# Patient Record
Sex: Female | Born: 1955 | Race: White | Hispanic: No | Marital: Married | Smoking: Never smoker
Health system: Southern US, Community
[De-identification: ages and names within clinical notes are randomized; demographics above are authoritative.]

## PROBLEM LIST (undated history)

## (undated) DIAGNOSIS — F32A Depression, unspecified: Secondary | ICD-10-CM

## (undated) DIAGNOSIS — M797 Fibromyalgia: Secondary | ICD-10-CM

## (undated) HISTORY — PX: ABDOMINAL HYSTERECTOMY: SHX81

## (undated) HISTORY — PX: OTHER SURGICAL HISTORY: SHX169

---
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
## 1983-12-22 DIAGNOSIS — M797 Fibromyalgia: Secondary | ICD-10-CM

## 1983-12-22 HISTORY — DX: Fibromyalgia: M79.7

## 2013-10-11 ENCOUNTER — Other Ambulatory Visit: Payer: Self-pay

## 2013-10-11 DIAGNOSIS — Z803 Family history of malignant neoplasm of breast: Secondary | ICD-10-CM

## 2013-10-11 DIAGNOSIS — Z1231 Encounter for screening mammogram for malignant neoplasm of breast: Secondary | ICD-10-CM

## 2013-11-09 ENCOUNTER — Ambulatory Visit: Payer: Self-pay

## 2017-10-21 HISTORY — PX: MASTECTOMY: SHX3

## 2018-01-21 HISTORY — PX: BREAST RECONSTRUCTION: SHX9

## 2018-09-20 HISTORY — PX: BREAST SURGERY: SHX581

## 2018-11-20 HISTORY — PX: BREAST SURGERY: SHX581
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
## 2019-10-05 ENCOUNTER — Ambulatory Visit: Payer: Self-pay

## 2019-10-05 ENCOUNTER — Encounter (INDEPENDENT_AMBULATORY_CARE_PROVIDER_SITE_OTHER): Payer: Self-pay

## 2019-10-05 ENCOUNTER — Encounter: Payer: Self-pay | Admitting: Rheumatology

## 2019-10-05 ENCOUNTER — Other Ambulatory Visit: Payer: Self-pay

## 2019-10-05 ENCOUNTER — Ambulatory Visit: Payer: BC Managed Care – PPO | Admitting: Rheumatology

## 2019-10-05 VITALS — BP 122/79 | HR 72 | Resp 12 | Ht 63.25 in | Wt 153.0 lb

## 2019-10-05 DIAGNOSIS — E538 Deficiency of other specified B group vitamins: Secondary | ICD-10-CM | POA: Insufficient documentation

## 2019-10-05 DIAGNOSIS — M797 Fibromyalgia: Secondary | ICD-10-CM

## 2019-10-05 DIAGNOSIS — M25552 Pain in left hip: Secondary | ICD-10-CM | POA: Diagnosis not present

## 2019-10-05 DIAGNOSIS — M25551 Pain in right hip: Secondary | ICD-10-CM | POA: Diagnosis not present

## 2019-10-05 DIAGNOSIS — E039 Hypothyroidism, unspecified: Secondary | ICD-10-CM

## 2019-10-05 DIAGNOSIS — M79642 Pain in left hand: Secondary | ICD-10-CM | POA: Diagnosis not present

## 2019-10-05 DIAGNOSIS — M545 Low back pain: Secondary | ICD-10-CM | POA: Diagnosis not present

## 2019-10-05 DIAGNOSIS — M542 Cervicalgia: Secondary | ICD-10-CM | POA: Diagnosis not present

## 2019-10-05 DIAGNOSIS — G8929 Other chronic pain: Secondary | ICD-10-CM

## 2019-10-05 DIAGNOSIS — M79641 Pain in right hand: Secondary | ICD-10-CM | POA: Diagnosis not present

## 2019-10-05 DIAGNOSIS — M51369 Other intervertebral disc degeneration, lumbar region without mention of lumbar back pain or lower extremity pain: Secondary | ICD-10-CM | POA: Insufficient documentation

## 2019-10-05 DIAGNOSIS — M79671 Pain in right foot: Secondary | ICD-10-CM

## 2019-10-05 DIAGNOSIS — M5136 Other intervertebral disc degeneration, lumbar region: Secondary | ICD-10-CM | POA: Insufficient documentation

## 2019-10-05 DIAGNOSIS — M25562 Pain in left knee: Secondary | ICD-10-CM

## 2019-10-05 DIAGNOSIS — M7061 Trochanteric bursitis, right hip: Secondary | ICD-10-CM | POA: Diagnosis not present

## 2019-10-05 DIAGNOSIS — K146 Glossodynia: Secondary | ICD-10-CM

## 2019-10-05 DIAGNOSIS — M7062 Trochanteric bursitis, left hip: Secondary | ICD-10-CM

## 2019-10-05 DIAGNOSIS — M79672 Pain in left foot: Secondary | ICD-10-CM

## 2019-10-05 DIAGNOSIS — H04123 Dry eye syndrome of bilateral lacrimal glands: Secondary | ICD-10-CM

## 2019-10-07 LAB — SEDIMENTATION RATE: Sed Rate: 6 mm/h (ref 0–30)

## 2019-10-07 LAB — CYCLIC CITRUL PEPTIDE ANTIBODY, IGG: Cyclic Citrullin Peptide Ab: <16 UNITS

## 2019-10-07 LAB — URIC ACID: Uric Acid, Serum: 5.3 mg/dL (ref 2.5–7.0)

## 2019-10-07 LAB — ANA: Anti Nuclear Antibody (ANA): NEGATIVE

## 2019-10-07 LAB — SJOGRENS SYNDROME-A EXTRACTABLE NUCLEAR ANTIBODY: SSA (Ro) (ENA) Antibody, IgG: <1 AI

## 2019-10-07 LAB — SJOGRENS SYNDROME-B EXTRACTABLE NUCLEAR ANTIBODY: SSB (La) (ENA) Antibody, IgG: <1 AI

## 2019-10-07 LAB — RHEUMATOID FACTOR: Rheumatoid fact SerPl-aCnc: <14 IU/mL (ref <14)

## 2019-10-09 NOTE — Progress Notes (Deleted)
Office Visit Note  Patient: Tracy Francis             Date of Birth: [birth year]           MRN: [id number]             PCP: System, Pcp Not In Referring: No ref. provider found Visit Date: 10/23/2019 Occupation: _0 @  Subjective:  No chief complaint on file.   History of Present Illness: Tracy Francis is a 62 y.o. female ***   Activities of Daily Living:  Patient reports morning stiffness for *** {minute/hour:19697}.   Patient {ACTIONS;DENIES/REPORTS:21021675::"Denies"} nocturnal pain.  Difficulty dressing/grooming: {ACTIONS;DENIES/REPORTS:21021675::"Denies"} Difficulty climbing stairs: {ACTIONS;DENIES/REPORTS:21021675::"Denies"} Difficulty getting out of chair: {ACTIONS;DENIES/REPORTS:21021675::"Denies"} Difficulty using hands for taps, buttons, cutlery, and/or writing: {ACTIONS;DENIES/REPORTS:21021675::"Denies"}  No Rheumatology ROS completed.   PMFS History:  Patient Active Problem List   Diagnosis Date Noted  . DDD (degenerative disc disease), lumbar 10/05/2019  . Dry eyes 10/05/2019  . Fibromyalgia 10/05/2019  . Acquired hypothyroidism 10/05/2019  . 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 B12 deficiency 10/05/2019    Past Medical History:  Diagnosis Date  . Fibromyalgia 1985    Family History  Problem Relation Age of Onset  . Breast cancer Mother   . Lung cancer Mother   . Arthritis Mother   . Diabetes Mother   . Hypertension Mother   . Aneurysm Mother   . Congestive Heart Failure Father   . Emphysema Father   . Hypothyroidism Sister   . COPD Brother   . Diabetes Brother   . Tremor Brother   . Healthy Daughter   . Heart murmur Son   . Healthy Son   . Polycystic ovary syndrome Daughter    Past Surgical History:  Procedure Laterality Date  . ABDOMINAL HYSTERECTOMY    . BREAST RECONSTRUCTION Bilateral 01/2018  . BREAST SURGERY Bilateral 11/2018   nipple reconstruction   . BREAST SURGERY Bilateral 09/2018   revision   . MASTECTOMY Bilateral 10/2017   . upper eye lift     Social History   Social History Narrative  . Not on file    There is no immunization history on file for this patient.   Objective: Vital Signs: There were no vitals taken for this visit.   Physical Exam   Musculoskeletal Exam: ***  CDAI Exam: CDAI Score: - Patient Global: -; Provider Global: - Swollen: -; Tender: - Joint Exam   No joint exam has been documented for this visit   There is currently no information documented on the homunculus. Go to the Rheumatology activity and complete the homunculus joint exam.  Investigation: No additional findings.  Imaging: Xr Hips Bilat W Or W/o Pelvis 3-4 Views  Result Date: 10/05/2019 No hip joint narrowing or sclerosis was noted.  Bilateral SI joints are unremarkable. Impression: Unremarkable x-ray of the hip joints.  Xr Cervical Spine 2 Or 3 Views  Result Date: 10/05/2019 Mild C6-C7 narrowing was noted.  Anterior spurring was noted.  Facet joint arthropathy was noted. Impression: These findings are consistent with C6-C7 narrowing and facet joint arthropathy.  Xr Hand 2 View Left  Result Date: 10/05/2019 PIP and DIP narrowing was noted.  No MCP, intercarpal radiocarpal joint space narrowing was noted. 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  Xr Hand 2 View Right  Result Date: 10/05/2019 PIP and DIP narrowing was noted.  No MCP, intercarpal radiocarpal joint space narrowing was noted. Impression: These findings are consistent with mild osteoarthritis of  the hand.  Xr Lumbar Spine 2-3 Views  Result Date: 10/05/2019 No significant disc space narrowing was noted.  Mild anterior spurring was noted.  Facet joint arthropathy was noted. 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   Recent Labs: No results found for: WBC, HGB, PLT, NA, K, CL, CO2, GLUCOSE, BUN, CREATININE, BILITOT, ALKPHOS, AST, ALT, PROT, ALBUMIN,  CALCIUM, GFRAA, QFTBGOLD, QFTBGOLDPLUS   October 05, 2019 ESR 6, RF negative, anti-CCP negative, uric acid 5.3, ANA negative, SSA negative, SSB negative  Speciality Comments: No specialty comments available.  Procedures:  No procedures performed Allergies: Patient has no known allergies.   Assessment / Plan:     Visit Diagnoses: No diagnosis found.  Orders: No orders of the defined types were placed in this encounter.  No orders of the defined types were placed in this encounter.   Face-to-face time spent with patient was *** minutes. Greater than 50% of time was spent in counseling and coordination of care.  Follow-Up Instructions: No follow-ups on file.   Bo Merino, MD  Note - This record has been created using Editor, commissioning.  Chart creation errors have been sought, but may not always  have been located. Such creation errors do not reflect on  the standard of medical care.

## 2019-10-17 NOTE — Progress Notes (Deleted)
Office Visit Note  Patient: Tracy Francis             Date of Birth: 03-Nov-1956           MRN: [id number]             PCP: System, Pcp Not In Referring: No ref. provider found Visit Date: 10/19/2019 Occupation: _0 @  Subjective:  No chief complaint on file.   History of Present Illness: Tracy Francis is a 63 y.o. female ***   Activities of Daily Living:  Patient reports morning stiffness for *** {minute/hour:19697}.   Patient {ACTIONS;DENIES/REPORTS:21021675::"Denies"} nocturnal pain.  Difficulty dressing/grooming: {ACTIONS;DENIES/REPORTS:21021675::"Denies"} Difficulty climbing stairs: {ACTIONS;DENIES/REPORTS:21021675::"Denies"} Difficulty getting out of chair: {ACTIONS;DENIES/REPORTS:21021675::"Denies"} Difficulty using hands for taps, buttons, cutlery, and/or writing: {ACTIONS;DENIES/REPORTS:21021675::"Denies"}  No Rheumatology ROS completed.   PMFS History:  Patient Active Problem List   Diagnosis Date Noted  . DDD (degenerative disc disease), lumbar 10/05/2019  . Dry eyes 10/05/2019  . Fibromyalgia 10/05/2019  . Acquired hypothyroidism 10/05/2019  . 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 B12 deficiency 10/05/2019    Past Medical History:  Diagnosis Date  . Fibromyalgia 1985    Family History  Problem Relation Age of Onset  . Breast cancer Mother   . Lung cancer Mother   . Arthritis Mother   . Diabetes Mother   . Hypertension Mother   . Aneurysm Mother   . Congestive Heart Failure Father   . Emphysema Father   . Hypothyroidism Sister   . COPD Brother   . Diabetes Brother   . Tremor Brother   . Healthy Daughter   . Heart murmur Son   . Healthy Son   . Polycystic ovary syndrome Daughter    Past Surgical History:  Procedure Laterality Date  . ABDOMINAL HYSTERECTOMY    . BREAST RECONSTRUCTION Bilateral 01/2018  . BREAST SURGERY Bilateral 11/2018   nipple reconstruction   . BREAST SURGERY Bilateral 09/2018   revision   . MASTECTOMY Bilateral 10/2017   . upper eye lift     Social History   Social History Narrative  . Not on file    There is no immunization history on file for this patient.   Objective: Vital Signs: There were no vitals taken for this visit.   Physical Exam   Musculoskeletal Exam: ***  CDAI Exam: CDAI Score: - Patient Global: -; Provider Global: - Swollen: -; Tender: - Joint Exam   No joint exam has been documented for this visit   There is currently no information documented on the homunculus. Go to the Rheumatology activity and complete the homunculus joint exam.  Investigation: No additional findings.  Imaging: Xr Hips Bilat W Or W/o Pelvis 3-4 Views  Result Date: 10/05/2019 No hip joint narrowing or sclerosis was noted.  Bilateral SI joints are unremarkable. Impression: Unremarkable x-ray of the hip joints.  Xr Cervical Spine 2 Or 3 Views  Result Date: 10/05/2019 Mild C6-C7 narrowing was noted.  Anterior spurring was noted.  Facet joint arthropathy was noted. Impression: These findings are consistent with C6-C7 narrowing and facet joint arthropathy.  Xr Hand 2 View Left  Result Date: 10/05/2019 PIP and DIP narrowing was noted.  No MCP, intercarpal radiocarpal joint space narrowing was noted. 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  Xr Hand 2 View Right  Result Date: 10/05/2019 PIP and DIP narrowing was noted.  No MCP, intercarpal radiocarpal joint space narrowing was noted. Impression: These findings are consistent with mild osteoarthritis of  the hand.  Xr Lumbar Spine 2-3 Views  Result Date: 10/05/2019 No significant disc space narrowing was noted.  Mild anterior spurring was noted.  Facet joint arthropathy was noted. 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   Recent Labs: No results found for: WBC, HGB, PLT, NA, K, CL, CO2, GLUCOSE, BUN, CREATININE, BILITOT, ALKPHOS, AST, ALT, PROT, ALBUMIN,  CALCIUM, GFRAA, QFTBGOLD, QFTBGOLDPLUS   October 05, 2019 RF negative, anti-CCP negative, ANA negative, Ro negative, La negative, ESR 6, uric acid 5.3  Speciality Comments: No specialty comments available.  Procedures:  No procedures performed Allergies: Patient has no known allergies.   Assessment / Plan:     Visit Diagnoses: No diagnosis found.  Orders: No orders of the defined types were placed in this encounter.  No orders of the defined types were placed in this encounter.   Face-to-face time spent with patient was *** minutes. Greater than 50% of time was spent in counseling and coordination of care.  Follow-Up Instructions: No follow-ups on file.   Bo Merino, MD  Note - This record has been created using Editor, commissioning.  Chart creation errors have been sought, but may not always  have been located. Such creation errors do not reflect on  the standard of medical care.

## 2019-10-19 ENCOUNTER — Ambulatory Visit: Payer: Self-pay | Admitting: Rheumatology

## 2019-10-19 NOTE — Progress Notes (Deleted)
Office Visit Note  Patient: Tracy Francis             Date of Birth: 1956/08/14           MRN: [id number]             PCP: System, Pcp Not In Referring: No ref. provider found Visit Date: 10/26/2019 Occupation: _0 @  Subjective:  No chief complaint on file.   History of Present Illness: Tracy Francis is a 63 y.o. female ***   Activities of Daily Living:  Patient reports morning stiffness for *** {minute/hour:19697}.   Patient {ACTIONS;DENIES/REPORTS:21021675::"Denies"} nocturnal pain.  Difficulty dressing/grooming: {ACTIONS;DENIES/REPORTS:21021675::"Denies"} Difficulty climbing stairs: {ACTIONS;DENIES/REPORTS:21021675::"Denies"} Difficulty getting out of chair: {ACTIONS;DENIES/REPORTS:21021675::"Denies"} Difficulty using hands for taps, buttons, cutlery, and/or writing: {ACTIONS;DENIES/REPORTS:21021675::"Denies"}  No Rheumatology ROS completed.   PMFS History:  Patient Active Problem List   Diagnosis Date Noted  . DDD (degenerative disc disease), lumbar 10/05/2019  . Dry eyes 10/05/2019  . Fibromyalgia 10/05/2019  . Acquired hypothyroidism 10/05/2019  . 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 B12 deficiency 10/05/2019    Past Medical History:  Diagnosis Date  . Fibromyalgia 1985    Family History  Problem Relation Age of Onset  . Breast cancer Mother   . Lung cancer Mother   . Arthritis Mother   . Diabetes Mother   . Hypertension Mother   . Aneurysm Mother   . Congestive Heart Failure Father   . Emphysema Father   . Hypothyroidism Sister   . COPD Brother   . Diabetes Brother   . Tremor Brother   . Healthy Daughter   . Heart murmur Son   . Healthy Son   . Polycystic ovary syndrome Daughter    Past Surgical History:  Procedure Laterality Date  . ABDOMINAL HYSTERECTOMY    . BREAST RECONSTRUCTION Bilateral 01/2018  . BREAST SURGERY Bilateral 11/2018   nipple reconstruction   . BREAST SURGERY Bilateral 09/2018   revision   . MASTECTOMY Bilateral 10/2017   . upper eye lift     Social History   Social History Narrative  . Not on file    There is no immunization history on file for this patient.   Objective: Vital Signs: There were no vitals taken for this visit.   Physical Exam   Musculoskeletal Exam: ***  CDAI Exam: CDAI Score: - Patient Global: -; Provider Global: - Swollen: -; Tender: - Joint Exam   No joint exam has been documented for this visit   There is currently no information documented on the homunculus. Go to the Rheumatology activity and complete the homunculus joint exam.  Investigation: No additional findings.  Imaging: Xr Hips Bilat W Or W/o Pelvis 3-4 Views  Result Date: 10/05/2019 No hip joint narrowing or sclerosis was noted.  Bilateral SI joints are unremarkable. Impression: Unremarkable x-ray of the hip joints.  Xr Cervical Spine 2 Or 3 Views  Result Date: 10/05/2019 Mild C6-C7 narrowing was noted.  Anterior spurring was noted.  Facet joint arthropathy was noted. Impression: These findings are consistent with C6-C7 narrowing and facet joint arthropathy.  Xr Hand 2 View Left  Result Date: 10/05/2019 PIP and DIP narrowing was noted.  No MCP, intercarpal radiocarpal joint space narrowing was noted. 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  Xr Hand 2 View Right  Result Date: 10/05/2019 PIP and DIP narrowing was noted.  No MCP, intercarpal radiocarpal joint space narrowing was noted. Impression: These findings are consistent with mild osteoarthritis of  the hand.  Xr Lumbar Spine 2-3 Views  Result Date: 10/05/2019 No significant disc space narrowing was noted.  Mild anterior spurring was noted.  Facet joint arthropathy was noted. 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   Recent Labs: No results found for: WBC, HGB, PLT, NA, K, CL, CO2, GLUCOSE, BUN, CREATININE, BILITOT, ALKPHOS, AST, ALT, PROT, ALBUMIN,  CALCIUM, GFRAA, QFTBGOLD, QFTBGOLDPLUS  Speciality Comments: No specialty comments available.   October 05, 2019 ESR 6, RF negative, anti-CCP negative, ANA negative, Ro negative, La negative, uric acid 5.3   Procedures:  No procedures performed Allergies: Patient has no known allergies.   Assessment / Plan:     Visit Diagnoses: No diagnosis found.  Orders: No orders of the defined types were placed in this encounter.  No orders of the defined types were placed in this encounter.   Face-to-face time spent with patient was *** minutes. Greater than 50% of time was spent in counseling and coordination of care.  Follow-Up Instructions: No follow-ups on file.   Bo Merino, MD  Note - This record has been created using Editor, commissioning.  Chart creation errors have been sought, but may not always  have been located. Such creation errors do not reflect on  the standard of medical care.

## 2019-10-23 ENCOUNTER — Ambulatory Visit: Payer: Self-pay | Admitting: Rheumatology

## 2019-10-26 ENCOUNTER — Ambulatory Visit: Payer: BC Managed Care – PPO | Admitting: Rheumatology

## 2022-01-13 ENCOUNTER — Other Ambulatory Visit: Payer: Self-pay | Admitting: Orthopedic Surgery

## 2022-01-13 DIAGNOSIS — M4722 Other spondylosis with radiculopathy, cervical region: Secondary | ICD-10-CM

## 2022-02-02 ENCOUNTER — Ambulatory Visit
Admission: RE | Admit: 2022-02-02 | Discharge: 2022-02-02 | Disposition: A | Discharge status: Home / Self Care | Payer: Medicare PPO | Source: Ambulatory Visit | Attending: Orthopedic Surgery | Admitting: Orthopedic Surgery

## 2022-02-02 ENCOUNTER — Other Ambulatory Visit: Payer: Self-pay

## 2022-02-02 DIAGNOSIS — M4722 Other spondylosis with radiculopathy, cervical region: Secondary | ICD-10-CM

## 2022-02-04 ENCOUNTER — Other Ambulatory Visit: Payer: Medicare PPO

## 2023-07-21 ENCOUNTER — Other Ambulatory Visit: Payer: Self-pay | Admitting: Orthopedic Surgery

## 2023-07-21 DIAGNOSIS — M4316 Spondylolisthesis, lumbar region: Secondary | ICD-10-CM

## 2023-08-03 ENCOUNTER — Encounter: Payer: Self-pay | Admitting: Orthopedic Surgery

## 2023-08-13 ENCOUNTER — Other Ambulatory Visit: Payer: Medicare PPO

## 2023-09-09 ENCOUNTER — Other Ambulatory Visit: Payer: Medicare PPO

## 2023-10-04 IMAGING — MR MR CERVICAL SPINE W/O CM
5 series · 37 of 48 positions shown · non-contrast
Comparison: MR cervical 07/30/2017; X-ray cervical 10/05/2019.

CLINICAL DATA: Neck pain radiating into right arm for 2 months.
History of trauma 16 years ago with a C2 and C3 fracture. Cervical
spondylosis with radiculopathy 0XB.FF ([id number]).

EXAM:
MRI CERVICAL SPINE WITHOUT CONTRAST
TECHNIQUE: Multiplanar, multisequence MR imaging of the cervical spine was
performed. No intravenous contrast was administered.

[Series 2: T2 · sagittal · 3.0mm · 0.41mm/px · 9 of 17 slices shown (1 of 2)]
[im 1/17]
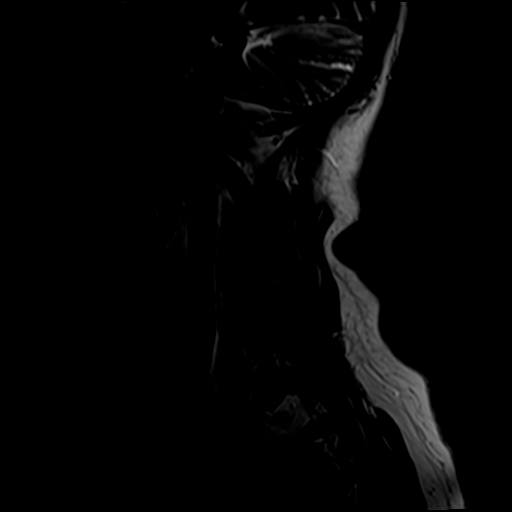
[im 3/17]
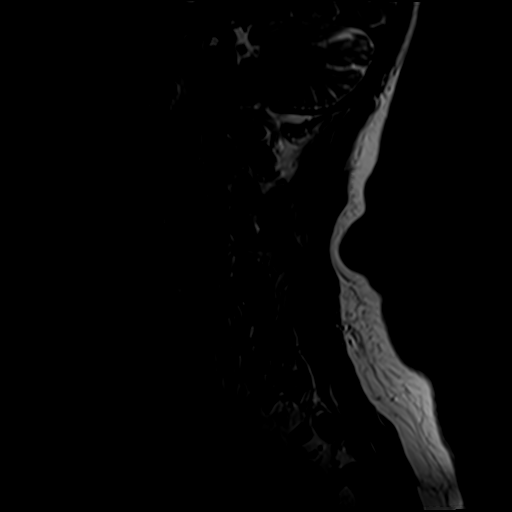
[im 5/17]
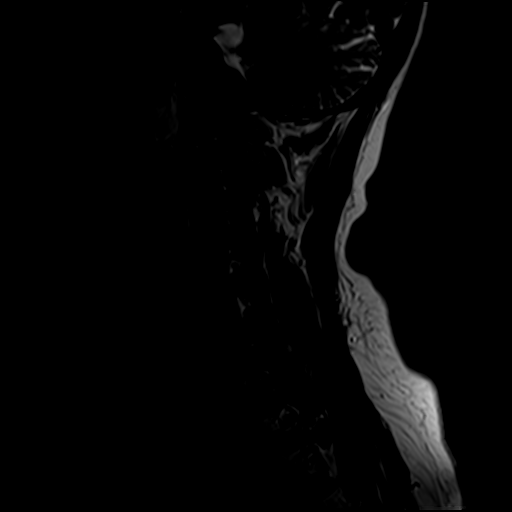
[im 7/17]
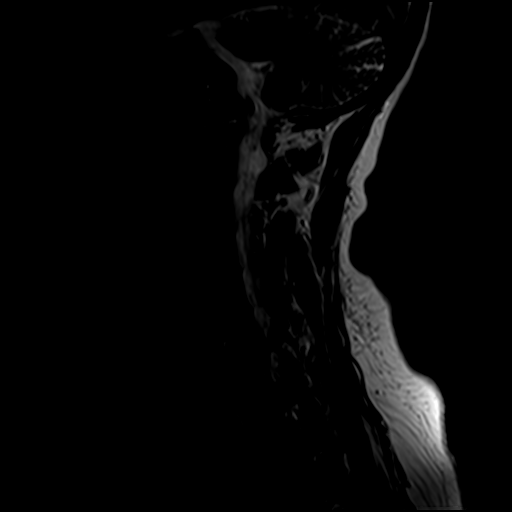
[im 9/17]
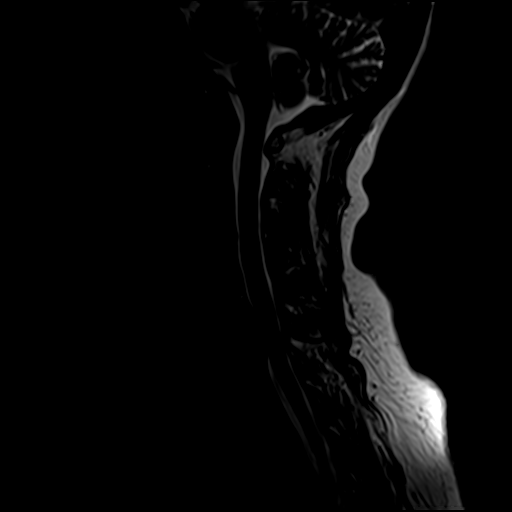
[im 11/17]
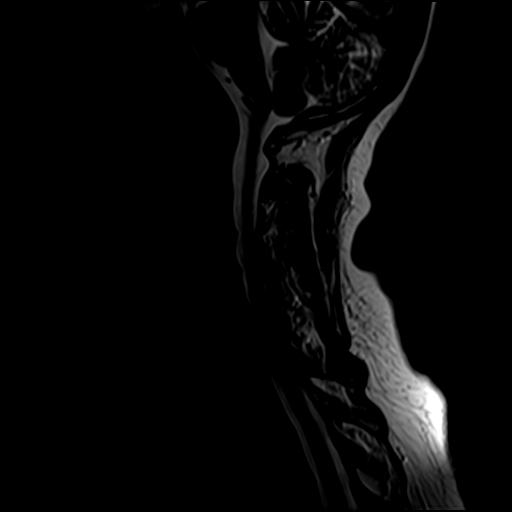
[im 13/17]
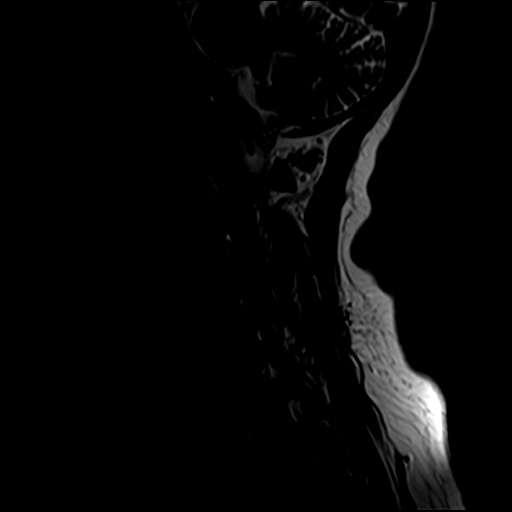
[im 15/17]
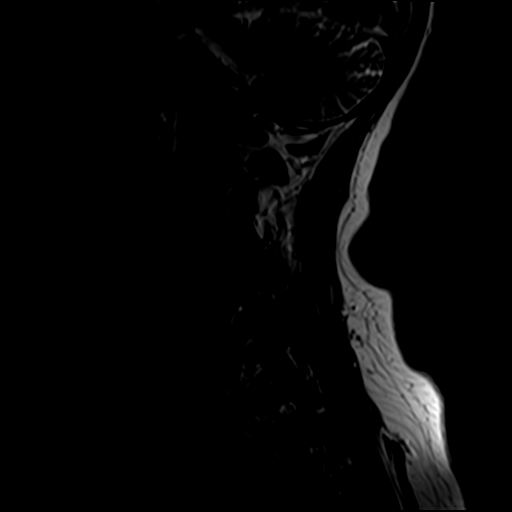
[im 17/17]
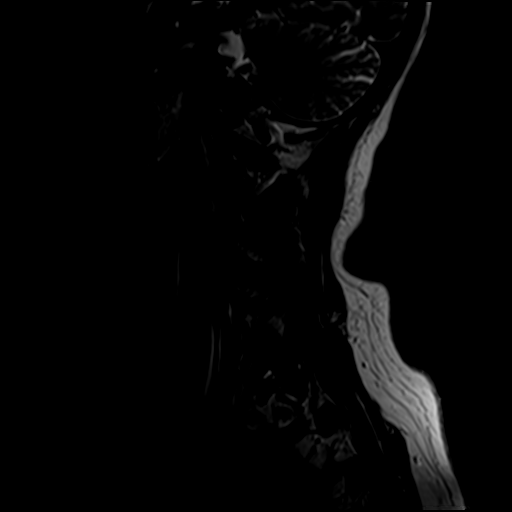

[Series 3: STIR · sagittal · 3.0mm · 0.82mm/px · 8 of 17 slices shown]
[im 1/17]
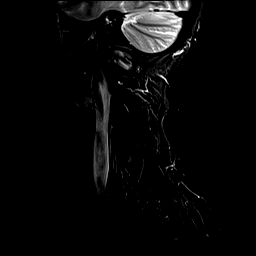
[im 3/17]
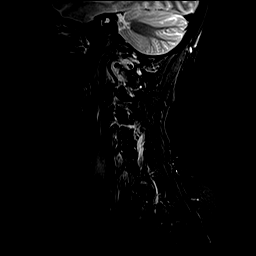
[im 5/17]
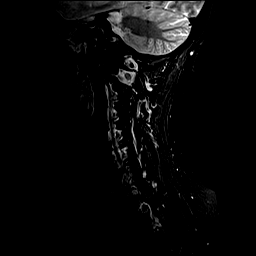
[im 7/17]
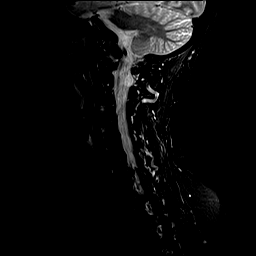
[im 10/17]
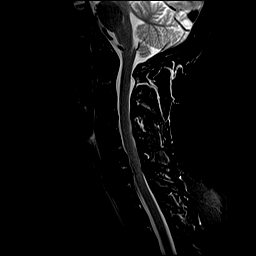
[im 12/17]
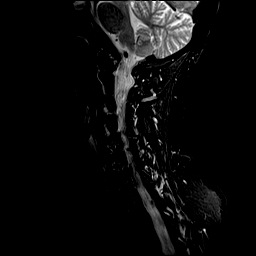
[im 14/17]
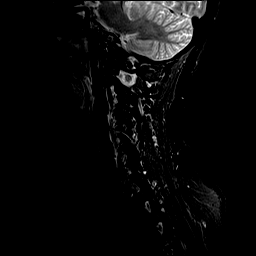
[im 17/17]
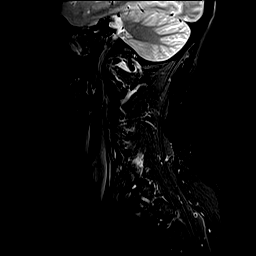

[Series 4: T1 · sagittal · 3.0mm · 0.82mm/px · 8 of 17 slices shown]
[im 1/17]
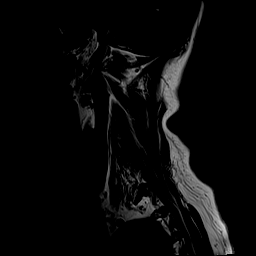
[im 3/17]
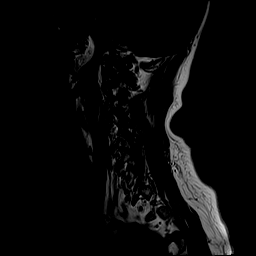
[im 5/17]
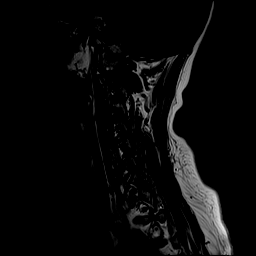
[im 7/17]
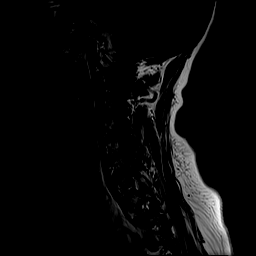
[im 10/17]
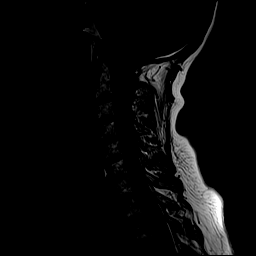
[im 12/17]
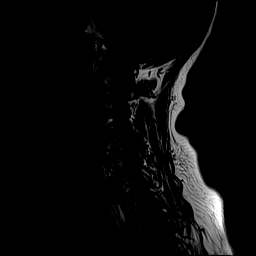
[im 14/17]
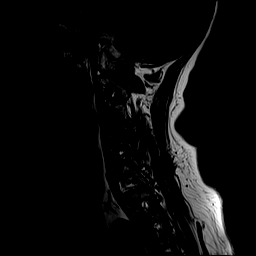
[im 17/17]
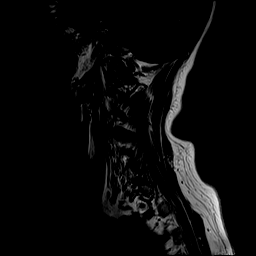

[Series 5: T2 · axial · 3.0mm · 0.70mm/px · z∈[-72,+19]mm · 11 of 24 slices shown (2 of 2)]
[im 1/24]
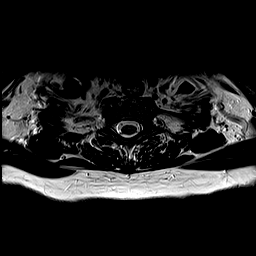
[im 3/24]
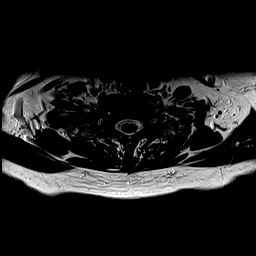
[im 5/24]
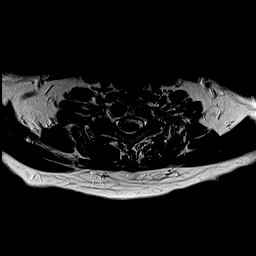
[im 7/24]
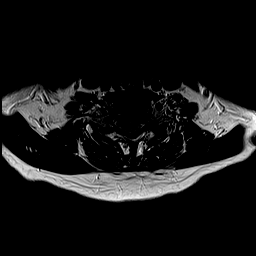
[im 10/24]
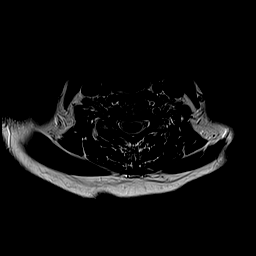
[im 12/24]
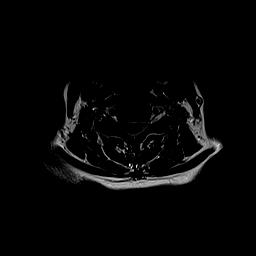
[im 14/24]
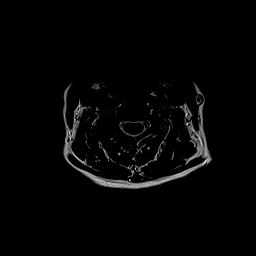
[im 17/24]
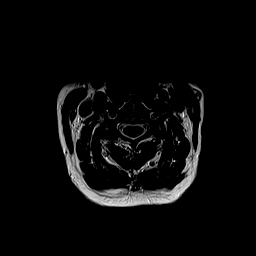
[im 19/24]
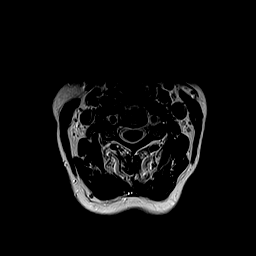
[im 21/24]
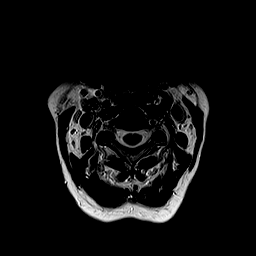
[im 24/24]
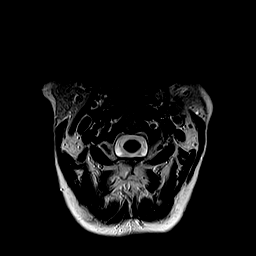

[Series 6: GRE · axial · 3.0mm · 0.35mm/px · 1 of 26 slices shown]
[im 1/26]
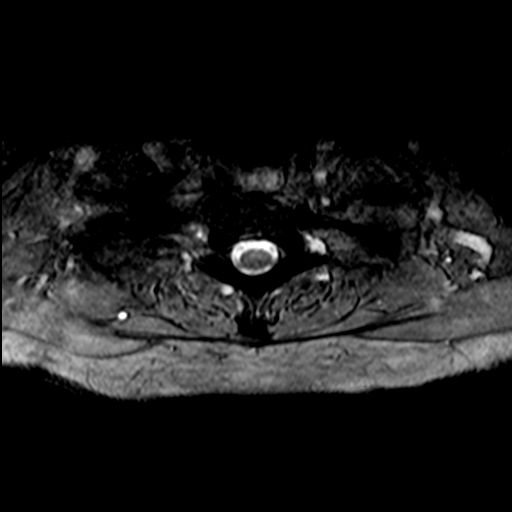

[37 of 48 positions shown; findings below may reference images not displayed]

FINDINGS: Alignment: Normal.

Vertebrae: No fracture, evidence of discitis, or bone lesion.
Endplate degenerative changes at C6-7.

Cord: Mild mass effect on the cord at C5-6 and C6-7 without cord
signal abnormality.

Posterior Fossa, vertebral arteries, paraspinal tissues: Negative.

Disc levels:

C2-3: Mild facet degenerative changes. No spinal canal or neural
foraminal stenosis.

C3-4: Uncovertebral and facet degenerative changes resulting in mild
right neural foraminal narrowing. No spinal canal.

C4-5: Facet degenerative changes, right greater than left. No
significant spinal canal or neural foraminal stenosis.

C5-6: Right posterolateral disc protrusion causing minimal mass
effect on the cord and resulting in mild spinal canal stenosis.
Uncovertebral and facet degenerative changes without significant
neural foraminal narrowing.

C6-7: Posterior disc osteophyte complex, asymmetric to the left,
resulting in mild mass effect on the cord and mild to moderate
spinal canal stenosis. Uncovertebral and facet degenerative changes
resulting in moderate right and severe left neural foraminal
narrowing.

C7-T1: Facet degenerative changes. No significant spinal canal or
neural foraminal stenosis.
IMPRESSION: Degenerative changes of the cervical spine, more pronounced at C6-7
where there is mild-to-moderate spinal canal stenosis, moderate
right and severe left neural foraminal narrowing.

## 2024-02-27 DIAGNOSIS — M5416 Radiculopathy, lumbar region: Secondary | ICD-10-CM | POA: Diagnosis not present

## 2024-03-02 DIAGNOSIS — M5416 Radiculopathy, lumbar region: Secondary | ICD-10-CM | POA: Diagnosis not present

## 2024-03-02 DIAGNOSIS — M47816 Spondylosis without myelopathy or radiculopathy, lumbar region: Secondary | ICD-10-CM | POA: Diagnosis not present

## 2024-03-06 DIAGNOSIS — Z01419 Encounter for gynecological examination (general) (routine) without abnormal findings: Secondary | ICD-10-CM | POA: Diagnosis not present

## 2024-03-06 DIAGNOSIS — Z6824 Body mass index (BMI) 24.0-24.9, adult: Secondary | ICD-10-CM | POA: Diagnosis not present

## 2024-03-06 DIAGNOSIS — N760 Acute vaginitis: Secondary | ICD-10-CM | POA: Diagnosis not present

## 2024-03-06 DIAGNOSIS — M545 Low back pain, unspecified: Secondary | ICD-10-CM | POA: Diagnosis not present

## 2024-03-07 DIAGNOSIS — H40053 Ocular hypertension, bilateral: Secondary | ICD-10-CM | POA: Diagnosis not present

## 2024-03-14 DIAGNOSIS — M47816 Spondylosis without myelopathy or radiculopathy, lumbar region: Secondary | ICD-10-CM | POA: Diagnosis not present

## 2024-03-27 DIAGNOSIS — M47816 Spondylosis without myelopathy or radiculopathy, lumbar region: Secondary | ICD-10-CM | POA: Diagnosis not present

## 2024-03-27 DIAGNOSIS — M25551 Pain in right hip: Secondary | ICD-10-CM | POA: Diagnosis not present

## 2024-03-27 DIAGNOSIS — M5416 Radiculopathy, lumbar region: Secondary | ICD-10-CM | POA: Diagnosis not present

## 2024-03-28 DIAGNOSIS — S93491A Sprain of other ligament of right ankle, initial encounter: Secondary | ICD-10-CM | POA: Diagnosis not present

## 2024-03-28 DIAGNOSIS — S93401D Sprain of unspecified ligament of right ankle, subsequent encounter: Secondary | ICD-10-CM | POA: Diagnosis not present

## 2024-03-28 DIAGNOSIS — M67961 Unspecified disorder of synovium and tendon, right lower leg: Secondary | ICD-10-CM | POA: Diagnosis not present

## 2024-03-28 DIAGNOSIS — G7249 Other inflammatory and immune myopathies, not elsewhere classified: Secondary | ICD-10-CM | POA: Diagnosis not present

## 2024-04-19 DIAGNOSIS — M25551 Pain in right hip: Secondary | ICD-10-CM | POA: Diagnosis not present

## 2024-04-20 DIAGNOSIS — I341 Nonrheumatic mitral (valve) prolapse: Secondary | ICD-10-CM | POA: Diagnosis not present

## 2024-04-20 DIAGNOSIS — R002 Palpitations: Secondary | ICD-10-CM | POA: Diagnosis not present

## 2024-04-20 DIAGNOSIS — R0602 Shortness of breath: Secondary | ICD-10-CM | POA: Diagnosis not present

## 2024-04-20 DIAGNOSIS — Z01810 Encounter for preprocedural cardiovascular examination: Secondary | ICD-10-CM | POA: Diagnosis not present

## 2024-04-24 DIAGNOSIS — R0602 Shortness of breath: Secondary | ICD-10-CM | POA: Diagnosis not present

## 2024-04-24 DIAGNOSIS — I341 Nonrheumatic mitral (valve) prolapse: Secondary | ICD-10-CM | POA: Diagnosis not present

## 2024-04-24 DIAGNOSIS — R002 Palpitations: Secondary | ICD-10-CM | POA: Diagnosis not present

## 2024-04-24 DIAGNOSIS — Z01810 Encounter for preprocedural cardiovascular examination: Secondary | ICD-10-CM | POA: Diagnosis not present

## 2024-04-25 DIAGNOSIS — M4316 Spondylolisthesis, lumbar region: Secondary | ICD-10-CM | POA: Diagnosis not present

## 2024-04-26 DIAGNOSIS — M25551 Pain in right hip: Secondary | ICD-10-CM | POA: Diagnosis not present

## 2024-04-26 DIAGNOSIS — M545 Low back pain, unspecified: Secondary | ICD-10-CM | POA: Diagnosis not present

## 2024-04-27 ENCOUNTER — Ambulatory Visit: Payer: Self-pay | Admitting: Orthopedic Surgery

## 2024-04-27 DIAGNOSIS — R768 Other specified abnormal immunological findings in serum: Secondary | ICD-10-CM | POA: Diagnosis not present

## 2024-04-27 DIAGNOSIS — M5431 Sciatica, right side: Secondary | ICD-10-CM | POA: Diagnosis not present

## 2024-04-27 DIAGNOSIS — M25571 Pain in right ankle and joints of right foot: Secondary | ICD-10-CM | POA: Diagnosis not present

## 2024-04-27 DIAGNOSIS — M48062 Spinal stenosis, lumbar region with neurogenic claudication: Secondary | ICD-10-CM

## 2024-04-27 DIAGNOSIS — M1991 Primary osteoarthritis, unspecified site: Secondary | ICD-10-CM | POA: Diagnosis not present

## 2024-04-27 DIAGNOSIS — M797 Fibromyalgia: Secondary | ICD-10-CM | POA: Diagnosis not present

## 2024-04-27 DIAGNOSIS — E663 Overweight: Secondary | ICD-10-CM | POA: Diagnosis not present

## 2024-04-27 DIAGNOSIS — Z6827 Body mass index (BMI) 27.0-27.9, adult: Secondary | ICD-10-CM | POA: Diagnosis not present

## 2024-04-27 NOTE — H&P (View-Only) (Signed)
 Tracy Francis is an 68 y.o. female.   Chief Complaint: back and right leg pain HPI: Location: right; low back Severity: pain level 8/10 Duration: 11 months Timing: chronic Context: fall; June 2024 Alleviating Factors: cymbalta 30mg  Ibuprofen or Meloxicam Aggravating Factors: sitting Associated Symptoms: right buttock and hip Prior Imaging: x ray; MRI Previous Injections: helped a little; 03/14/2024 - bilateral facet L4-5 Dr Phares Brasher 01/06/24 Right troch injection and 11/10/23 bilat SI joint injection Notes: AO Paola Bohr, PA 06/18/23 - x-rays - patient followed up with pain management @ [facility] after. Dr. Vonn Guard at [facility] Dr. Blain Bulls @ [facility] - Neurology Dr. Nadia Aurora - [facility] - ordered lumbar MRI right hip MRI follow up pain radiates down the buttock thigh down to her outside of her calf and top of her foot.  Past Medical History:  Diagnosis Date   Fibromyalgia 1985    Past Surgical History:  Procedure Laterality Date   ABDOMINAL HYSTERECTOMY     BREAST RECONSTRUCTION Bilateral 01/2018   BREAST SURGERY Bilateral 11/2018   nipple reconstruction    BREAST SURGERY Bilateral 09/2018   revision    MASTECTOMY Bilateral 10/2017   upper eye lift      Family History  Problem Relation Age of Onset   Breast cancer Mother    Lung cancer Mother    Arthritis Mother    Diabetes Mother    Hypertension Mother    Aneurysm Mother    Congestive Heart Failure Father    Emphysema Father    Hypothyroidism Sister    COPD Brother    Diabetes Brother    Tremor Brother    Healthy Daughter    Heart murmur Son    Healthy Son    Polycystic ovary syndrome Daughter    Social History:  reports that she has never smoked. She has never used smokeless tobacco. She reports current alcohol use. She reports that she does not use drugs.  Allergies: No Known Allergies  Current meds: Armour Thyroid 15 mg tablet diclofenac sodium 75 mg tablet,delayed  release DULoxetine 30 mg capsule,delayed release estradioL 0.01% (0.1 mg/gram) vaginal cream estradioL 0.025 mg/24 hr semiweekly transdermal patch EstroGel 1.25 gram/actuation (0.06%) transdermal gel pump fluconazole 150 mg tablet meloxicam 7.5 mg tablet methocarbamoL 500 mg tablet nystatin 100,000 unit/gram topical cream pantoprazole 40 mg tablet,delayed release phenazopyridine 200 mg tablet Saxenda 3 mg/0.5 mL (18 mg/3 mL) subcutaneous pen injector terconazole 0.8 % vaginal cream triamcinolone acetonide 0.1 % topical cream  Review of Systems  Constitutional: Negative.   HENT: Negative.    Eyes: Negative.   Respiratory: Negative.    Cardiovascular: Negative.   Gastrointestinal: Negative.   Endocrine: Negative.   Genitourinary: Negative.   Musculoskeletal:  Positive for back pain and gait problem.  Skin: Negative.   Neurological:  Positive for weakness and numbness.  Psychiatric/Behavioral: Negative.      There were no vitals taken for this visit. Physical Exam Constitutional:      Appearance: Normal appearance.  HENT:     Head: Normocephalic and atraumatic.     Right Ear: External ear normal.     Left Ear: External ear normal.     Nose: Nose normal.     Mouth/Throat:     Pharynx: Oropharynx is clear.  Eyes:     Conjunctiva/sclera: Conjunctivae normal.  Cardiovascular:     Rate and Rhythm: Normal rate and regular rhythm.     Pulses: Normal pulses.     Heart sounds: Normal  heart sounds.  Pulmonary:     Effort: Pulmonary effort is normal.     Breath sounds: Normal breath sounds.  Abdominal:     General: Bowel sounds are normal.  Musculoskeletal:     Cervical back: Normal range of motion and neck supple.     Comments: Tender right proximal gluteus. She has no pain in the groin with internal/external rotation of the hip she is tender over the trochanter. Straight leg raise is markedly positive her buttock thigh lateral calf pain exacerbated with a dorsal augmentation  maneuver EHL on the right is 4/5 compared to the left. Sensation is intact.  Neurological:     Mental Status: She is alert.    MRI of the hip demonstrates some tearing of the anterior superior labrum. Global labral degeneration and moderate to severe tendinosis of the gluteus minimus. 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  MRI of the lumbar spine after speaking with the radiologist indicates a minimal spondylolisthesis at L4-5 due to facet arthrosis. 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  Assessment/Plan Impression:  1. Right lower extremity radicular pain L5 nerve root distribution secondary to lateral recess stenosis and a synovial cyst impinging upon the L5 nerve root with myotomal weakness dermatomal dysesthesias 2. Tearing of the anterior superior labrum with moderate to severe tendinosis of the gluteus minimus 3. Positive ANA  Plan:  I do feel that her clinical symptoms are consistent with the pathology noted on her MRI. Facet hypertrophy and a synovial cyst impinging upon the L5 nerve root. She has neural tension signs and EHL weakness and has failed conservative treatment. Options include living with her symptoms versus a lumbar micro hemilaminotomy and excision of synovial cyst. This would address the buttock and leg pain only. Not back pain. I indicated any back pain would require a fusion. I believe the minimal listhesis and the well-maintained disc space does not indicate for a concomitant fusion. She does have some back pain but is not disabling like her leg pain.  In terms of her hip she does not have groin pain with internal/external rotation of the hip or mechanical catching. Likely the labrum is asymptomatic. 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 That may be related to spasming of the gluteus and tightening of the tensor fascia lotta. That may improve following the decompression. Will have her see one of the hip  guys for an evaluation.  I had an extensive discussion with the patient concerning the pathology relevant anatomy and treatment options. At this point exhausting conservative treatment and in the presence of a neurologic deficit we discussed microlumbar decompression. I discussed the risks and benefits including bleeding, infection, DVT, PE, anesthetic complications, worsening in their symptoms, improvement in their symptoms, C SF leakage, epidural fibrosis, need for future surgeries such as revision discectomy and lumbar fusion. I also indicated that this is an operation to basically decompress the nerve roots to allow recovery as opposed to fixing a herniated disc if it is encountered and that the incidence of recurrent chest disc herniation can approach 15%. Also that nerve root recovery is variable and may not recover completely. Any ligament or bone that is contributing to compressing the nerves will be removed as well.  I discussed the operative course including overnight in the hospital. Immediate ambulation. Follow-up in 2 weeks for suture removal. 6 weeks until healing of the herniation and surgical incision followed by 6 weeks of reconditioning and strengthening  of the core musculature. Also discussed the need to employ the concepts of disc pressure management and core motion following the surgery to minimize the risk of recurrent disc herniation. We will obtain preoperative clearance i if necessary and proceed accordingly.  She will call after she sees the rheumatologist further elevated ANA. Preoperative clearance. Capsule. Oxycodone. No history of DVT or MRSA.  Impression: hemilaminotomy L4-5 right  Barba Levin, PA-C for Dr Leighton Punches 04/27/2024, 12:23 PM

## 2024-04-27 NOTE — H&P (Signed)
 Tracy Francis is an 68 y.o. female.   Chief Complaint: back and right leg pain HPI: Location: right; low back Severity: pain level 8/10 Duration: 11 months Timing: chronic Context: fall; June 2024 Alleviating Factors: cymbalta 30mg  Ibuprofen or Meloxicam Aggravating Factors: sitting Associated Symptoms: right buttock and hip Prior Imaging: x ray; MRI Previous Injections: helped a little; 03/14/2024 - bilateral facet L4-5 Dr Phares Brasher 01/06/24 Right troch injection and 11/10/23 bilat SI joint injection Notes: AO Paola Bohr, PA 06/18/23 - x-rays - patient followed up with pain management @ [facility] after. Dr. Vonn Guard at [facility] Dr. Blain Bulls @ [facility] in [facility] - Neurology Dr. Nadia Aurora - [facility] - ordered lumbar MRI right hip MRI follow up pain radiates down the buttock thigh down to her outside of her calf and top of her foot.  Past Medical History:  Diagnosis Date   Fibromyalgia 1985    Past Surgical History:  Procedure Laterality Date   ABDOMINAL HYSTERECTOMY     BREAST RECONSTRUCTION Bilateral 01/2018   BREAST SURGERY Bilateral 11/2018   nipple reconstruction    BREAST SURGERY Bilateral 09/2018   revision    MASTECTOMY Bilateral 10/2017   upper eye lift      Family History  Problem Relation Age of Onset   Breast cancer Mother    Lung cancer Mother    Arthritis Mother    Diabetes Mother    Hypertension Mother    Aneurysm Mother    Congestive Heart Failure Father    Emphysema Father    Hypothyroidism Sister    COPD Brother    Diabetes Brother    Tremor Brother    Healthy Daughter    Heart murmur Son    Healthy Son    Polycystic ovary syndrome Daughter    Social History:  reports that she has never smoked. She has never used smokeless tobacco. She reports current alcohol use. She reports that she does not use drugs.  Allergies: No Known Allergies  Current meds: Armour Thyroid 15 mg tablet diclofenac sodium 75 mg tablet,delayed  release DULoxetine 30 mg capsule,delayed release estradioL 0.01% (0.1 mg/gram) vaginal cream estradioL 0.025 mg/24 hr semiweekly transdermal patch EstroGel 1.25 gram/actuation (0.06%) transdermal gel pump fluconazole 150 mg tablet meloxicam 7.5 mg tablet methocarbamoL 500 mg tablet nystatin 100,000 unit/gram topical cream pantoprazole 40 mg tablet,delayed release phenazopyridine 200 mg tablet Saxenda 3 mg/0.5 mL (18 mg/3 mL) subcutaneous pen injector terconazole 0.8 % vaginal cream triamcinolone acetonide 0.1 % topical cream  Review of Systems  Constitutional: Negative.   HENT: Negative.    Eyes: Negative.   Respiratory: Negative.    Cardiovascular: Negative.   Gastrointestinal: Negative.   Endocrine: Negative.   Genitourinary: Negative.   Musculoskeletal:  Positive for back pain and gait problem.  Skin: Negative.   Neurological:  Positive for weakness and numbness.  Psychiatric/Behavioral: Negative.      There were no vitals taken for this visit. Physical Exam Constitutional:      Appearance: Normal appearance.  HENT:     Head: Normocephalic and atraumatic.     Right Ear: External ear normal.     Left Ear: External ear normal.     Nose: Nose normal.     Mouth/Throat:     Pharynx: Oropharynx is clear.  Eyes:     Conjunctiva/sclera: Conjunctivae normal.  Cardiovascular:     Rate and Rhythm: Normal rate and regular rhythm.     Pulses: Normal pulses.     Heart sounds: Normal  heart sounds.  Pulmonary:     Effort: Pulmonary effort is normal.     Breath sounds: Normal breath sounds.  Abdominal:     General: Bowel sounds are normal.  Musculoskeletal:     Cervical back: Normal range of motion and neck supple.     Comments: Tender right proximal gluteus. She has no pain in the groin with internal/external rotation of the hip she is tender over the trochanter. Straight leg raise is markedly positive her buttock thigh lateral calf pain exacerbated with a dorsal augmentation  maneuver EHL on the right is 4/5 compared to the left. Sensation is intact.  Neurological:     Mental Status: She is alert.    MRI of the hip demonstrates some tearing of the anterior superior labrum. Global labral degeneration and moderate to severe tendinosis of the gluteus minimus. 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  MRI of the lumbar spine after speaking with the radiologist indicates a minimal spondylolisthesis at L4-5 due to facet arthrosis. 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  Assessment/Plan Impression:  1. Right lower extremity radicular pain L5 nerve root distribution secondary to lateral recess stenosis and a synovial cyst impinging upon the L5 nerve root with myotomal weakness dermatomal dysesthesias 2. Tearing of the anterior superior labrum with moderate to severe tendinosis of the gluteus minimus 3. Positive ANA  Plan:  I do feel that her clinical symptoms are consistent with the pathology noted on her MRI. Facet hypertrophy and a synovial cyst impinging upon the L5 nerve root. She has neural tension signs and EHL weakness and has failed conservative treatment. Options include living with her symptoms versus a lumbar micro hemilaminotomy and excision of synovial cyst. This would address the buttock and leg pain only. Not back pain. I indicated any back pain would require a fusion. I believe the minimal listhesis and the well-maintained disc space does not indicate for a concomitant fusion. She does have some back pain but is not disabling like her leg pain.  In terms of her hip she does not have groin pain with internal/external rotation of the hip or mechanical catching. Likely the labrum is asymptomatic. 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 That may be related to spasming of the gluteus and tightening of the tensor fascia lotta. That may improve following the decompression. Will have her see one of the hip  guys for an evaluation.  I had an extensive discussion with the patient concerning the pathology relevant anatomy and treatment options. At this point exhausting conservative treatment and in the presence of a neurologic deficit we discussed microlumbar decompression. I discussed the risks and benefits including bleeding, infection, DVT, PE, anesthetic complications, worsening in their symptoms, improvement in their symptoms, C SF leakage, epidural fibrosis, need for future surgeries such as revision discectomy and lumbar fusion. I also indicated that this is an operation to basically decompress the nerve roots to allow recovery as opposed to fixing a herniated disc if it is encountered and that the incidence of recurrent chest disc herniation can approach 15%. Also that nerve root recovery is variable and may not recover completely. Any ligament or bone that is contributing to compressing the nerves will be removed as well.  I discussed the operative course including overnight in the hospital. Immediate ambulation. Follow-up in 2 weeks for suture removal. 6 weeks until healing of the herniation and surgical incision followed by 6 weeks of reconditioning and strengthening  of the core musculature. Also discussed the need to employ the concepts of disc pressure management and core motion following the surgery to minimize the risk of recurrent disc herniation. We will obtain preoperative clearance i if necessary and proceed accordingly.  She will call after she sees the rheumatologist further elevated ANA. Preoperative clearance. Capsule. Oxycodone. No history of DVT or MRSA.  Impression: hemilaminotomy L4-5 right  Barba Levin, PA-C for Dr Leighton Punches 04/27/2024, 12:23 PM

## 2024-05-04 NOTE — Progress Notes (Signed)
 Surgical Instructions   Your procedure is scheduled on May 18, 2024. Report to [facility] Main Entrance "A" at 5:30 A.M., then check in with the Admitting office. Any questions or running late day of surgery: call [phone]  Questions prior to your surgery date: call [phone], Monday-Friday, 8am-4pm. If you experience any cold or flu symptoms such as cough, fever, chills, shortness of breath, etc. between now and your scheduled surgery, please notify us  at the above number.     Remember:  Do not eat after midnight the night before your surgery  You may drink clear liquids until 4:30 the morning of your surgery.   Clear liquids allowed are: Water, Non-Citrus Juices (without pulp), Carbonated Beverages, Clear Tea (no milk, honey, etc.), Black Coffee Only (NO MILK, CREAM OR POWDERED CREAMER of any kind), and Gatorade.   Patient Instructions  The night before surgery:  No food after midnight. ONLY clear liquids after midnight  The day of surgery (if you do NOT have diabetes):  Drink ONE (1) Pre-Surgery Clear Ensure by 4:30 the morning of surgery. Drink in one sitting. Do not sip.  This drink was given to you during your hospital  pre-op appointment visit.  Nothing else to drink after completing the  Pre-Surgery Clear Ensure.          If you have questions, please contact your surgeon's office.  Take these medicines the morning of surgery with A SIP OF WATER  DULoxetine (CYMBALTA)  escitalopram (LEXAPRO)  levothyroxine (SYNTHROID)  liothyronine (CYTOMEL)  pantoprazole (PROTONIX)   May take these medicines IF NEEDED: acetaminophen (TYLENOL)    One week prior to surgery, STOP taking any Aspirin (unless otherwise instructed by your surgeon) Aleve, Naproxen, Ibuprofen, Motrin, Advil, Goody's, BC's, all herbal medications, fish oil, and non-prescription vitamins. THIS INCLUDES YOUR diclofenac (VOLTAREN), Omega-3 Fatty Acids (OMEGA 3 PO).                      Do NOT Smoke  (Tobacco/Vaping) for 24 hours prior to your procedure.  If you use a CPAP at night, you may bring your mask/headgear for your overnight stay.   You will be asked to remove any contacts, glasses, piercing's, hearing aid's, dentures/partials prior to surgery. Please bring cases for these items if needed.    Patients discharged the day of surgery will not be allowed to drive home, and someone needs to stay with them for 24 hours.  SURGICAL WAITING ROOM VISITATION Patients may have no more than 2 support people in the waiting area - these visitors may rotate.   Pre-op nurse will coordinate an appropriate time for 1 ADULT support person, who may not rotate, to accompany patient in pre-op.  Children under the age of 75 must have an adult with them who is not the patient and must remain in the main waiting area with an adult.  If the patient needs to stay at the hospital during part of their recovery, the visitor guidelines for inpatient rooms apply.  Please refer to the [facility] website for the visitor guidelines for any additional information.   If you received a COVID test during your pre-op visit  it is requested that you wear a mask when out in public, stay away from anyone that may not be feeling well and notify your surgeon if you develop symptoms. If you have been in contact with anyone that has tested positive in the last 10 days please notify you surgeon.  Pre-operative 5 CHG Bathing Instructions   You can play a key role in reducing the risk of infection after surgery. Your skin needs to be as free of germs as possible. You can reduce the number of germs on your skin by washing with CHG (chlorhexidine gluconate) soap before surgery. CHG is an antiseptic soap that kills germs and continues to kill germs even after washing.   DO NOT use if you have an allergy to chlorhexidine/CHG or antibacterial soaps. If your skin becomes reddened or irritated, stop using the CHG and notify one  of our RNs at [phone].   Please shower with the CHG soap starting 4 days before surgery using the following schedule:     Please keep in mind the following:  DO NOT shave, including legs and underarms, starting the day of your first shower.   You may shave your face at any point before/day of surgery.  Place clean sheets on your bed the day you start using CHG soap. Use a clean washcloth (not used since being washed) for each shower. DO NOT sleep with pets once you start using the CHG.   CHG Shower Instructions:  Wash your face and private area with normal soap. If you choose to wash your hair, wash first with your normal shampoo.  After you use shampoo/soap, rinse your hair and body thoroughly to remove shampoo/soap residue.  Turn the water OFF and apply about 3 tablespoons (45 ml) of CHG soap to a CLEAN washcloth.  Apply CHG soap ONLY FROM YOUR NECK DOWN TO YOUR TOES (washing for 3-5 minutes)  DO NOT use CHG soap on face, private areas, open wounds, or sores.  Pay special attention to the area where your surgery is being performed.  If you are having back surgery, having someone wash your back for you may be helpful. Wait 2 minutes after CHG soap is applied, then you may rinse off the CHG soap.  Pat dry with a clean towel  Put on clean clothes/pajamas   If you choose to wear lotion, please use ONLY the CHG-compatible lotions that are listed below.  Additional instructions for the day of surgery: DO NOT APPLY any lotions, deodorants, cologne, or perfumes.   Do not bring valuables to the hospital. [facility] is not responsible for any belongings/valuables. Do not wear nail polish, gel polish, artificial nails, or any other type of covering on natural nails (fingers and toes) Do not wear jewelry or makeup Put on clean/comfortable clothes.  Please brush your teeth.  Ask your nurse before applying any prescription medications to the skin.     CHG Compatible Lotions   Aveeno  Moisturizing lotion  Cetaphil Moisturizing Cream  Cetaphil Moisturizing Lotion  Clairol Herbal Essence Moisturizing Lotion, Dry Skin  Clairol Herbal Essence Moisturizing Lotion, Extra Dry Skin  Clairol Herbal Essence Moisturizing Lotion, Normal Skin  Curel Age Defying Therapeutic Moisturizing Lotion with Alpha Hydroxy  Curel Extreme Care Body Lotion  Curel Soothing Hands Moisturizing Hand Lotion  Curel Therapeutic Moisturizing Cream, Fragrance-Free  Curel Therapeutic Moisturizing Lotion, Fragrance-Free  Curel Therapeutic Moisturizing Lotion, Original Formula  Eucerin Daily Replenishing Lotion  Eucerin Dry Skin Therapy Plus Alpha Hydroxy Crme  Eucerin Dry Skin Therapy Plus Alpha Hydroxy Lotion  Eucerin Original Crme  Eucerin Original Lotion  Eucerin Plus Crme Eucerin Plus Lotion  Eucerin TriLipid Replenishing Lotion  Keri Anti-Bacterial Hand Lotion  Keri Deep Conditioning Original Lotion Dry Skin Formula Softly Scented  Keri Deep Conditioning Original Lotion, Fragrance Free Sensitive  Skin Formula  Keri Lotion Fast Absorbing Fragrance Free Sensitive Skin Formula  Keri Lotion Fast Absorbing Softly Scented Dry Skin Formula  Keri Original Lotion  Keri Skin Renewal Lotion Keri Silky Smooth Lotion  Keri Silky Smooth Sensitive Skin Lotion  Nivea Body Creamy Conditioning Oil  Nivea Body Extra Enriched Lotion  Nivea Body Original Lotion  Nivea Body Sheer Moisturizing Lotion Nivea Crme  Nivea Skin Firming Lotion  NutraDerm 30 Skin Lotion  NutraDerm Skin Lotion  NutraDerm Therapeutic Skin Cream  NutraDerm Therapeutic Skin Lotion  ProShield Protective Hand Cream  Provon moisturizing lotion  Please read over the following fact sheets that you were given.

## 2024-05-05 ENCOUNTER — Other Ambulatory Visit: Payer: Self-pay

## 2024-05-05 ENCOUNTER — Ambulatory Visit (HOSPITAL_COMMUNITY)
Admission: RE | Admit: 2024-05-05 | Discharge: 2024-05-05 | Disposition: A | Discharge status: Home / Self Care | Source: Ambulatory Visit | Attending: Orthopedic Surgery | Admitting: Orthopedic Surgery

## 2024-05-05 ENCOUNTER — Encounter (HOSPITAL_COMMUNITY): Payer: Self-pay

## 2024-05-05 ENCOUNTER — Encounter (HOSPITAL_COMMUNITY)
Admission: RE | Admit: 2024-05-05 | Discharge: 2024-05-05 | Disposition: A | Discharge status: Home / Self Care | Source: Ambulatory Visit | Attending: Specialist | Admitting: Specialist

## 2024-05-05 VITALS — BP 130/80 | HR 68 | Temp 97.9°F | Resp 16 | Ht 63.0 in | Wt 157.1 lb

## 2024-05-05 DIAGNOSIS — M25551 Pain in right hip: Secondary | ICD-10-CM | POA: Diagnosis not present

## 2024-05-05 DIAGNOSIS — I341 Nonrheumatic mitral (valve) prolapse: Secondary | ICD-10-CM | POA: Diagnosis not present

## 2024-05-05 DIAGNOSIS — M4316 Spondylolisthesis, lumbar region: Secondary | ICD-10-CM | POA: Diagnosis not present

## 2024-05-05 DIAGNOSIS — M5126 Other intervertebral disc displacement, lumbar region: Secondary | ICD-10-CM | POA: Diagnosis not present

## 2024-05-05 DIAGNOSIS — M48062 Spinal stenosis, lumbar region with neurogenic claudication: Secondary | ICD-10-CM

## 2024-05-05 DIAGNOSIS — Z01818 Encounter for other preprocedural examination: Secondary | ICD-10-CM | POA: Diagnosis not present

## 2024-05-05 HISTORY — DX: Depression, unspecified: F32.A

## 2024-05-05 LAB — BASIC METABOLIC PANEL WITH GFR
Anion gap: 11 (ref 5–15)
BUN: 25 mg/dL — ABNORMAL HIGH (ref 8–23)
CO2: 24 mmol/L (ref 22–32)
Calcium: 10 mg/dL (ref 8.9–10.3)
Chloride: 103 mmol/L (ref 98–111)
Creatinine, Ser: 0.83 mg/dL (ref 0.44–1.00)
GFR, Estimated: >60 mL/min (ref >60)
Glucose, Bld: 105 mg/dL — ABNORMAL HIGH (ref 70–99)
Potassium: 4.9 mmol/L (ref 3.5–5.1)
Sodium: 138 mmol/L (ref 135–145)

## 2024-05-05 LAB — CBC
HCT: 43 % (ref 36.0–46.0)
Hemoglobin: 14.7 g/dL (ref 12.0–15.0)
MCH: 30.2 pg (ref 26.0–34.0)
MCHC: 34.2 g/dL (ref 30.0–36.0)
MCV: 88.3 fL (ref 80.0–100.0)
Platelets: 284 10*3/uL (ref 150–400)
RBC: 4.87 MIL/uL (ref 3.87–5.11)
RDW: 12.9 % (ref 11.5–15.5)
WBC: 8.1 10*3/uL (ref 4.0–10.5)
nRBC: 0 % (ref 0.0–0.2)

## 2024-05-05 LAB — SURGICAL PCR SCREEN
MRSA, PCR: NEGATIVE
Staphylococcus aureus: NEGATIVE

## 2024-05-05 NOTE — Progress Notes (Signed)
 PCP -  Cardiologist -   PPM/ICD -  Device Orders -  Rep Notified -   Chest x-ray -  EKG - requested Stress Test - requested ECHO - 05-14-24 Cardiac Cath -   Sleep Study - denies CPAP -   Dm- denies  Blood Thinner Instructions:denies Aspirin Instructions:  ERAS Protcol - clear liquids until 4:30 PRE-SURGERY Ensure   COVID TEST- n/a   Anesthesia review: yes cardiac history r  Patient denies shortness of breath, fever, cough and chest pain at PAT appointment   All instructions explained to the patient, with a verbal understanding of the material. Patient agrees to go over the instructions while at home for a better understanding. Patient also instructed to self quarantine after being tested for COVID-19. The opportunity to ask questions was provided.

## 2024-05-05 NOTE — Progress Notes (Signed)
 Surgical Instructions     Your procedure is scheduled on May 18, 2024. Report to [facility] Main Entrance "A" at 5:30 A.M., then check in with the Admitting office. Any questions or running late day of surgery: call [phone]   Questions prior to your surgery date: call [phone], Monday-Friday, 8am-4pm. If you experience any cold or flu symptoms such as cough, fever, chills, shortness of breath, etc. between now and your scheduled surgery, please notify us  at the above number.            Remember:       Do not eat after midnight the night before your surgery   You may drink clear liquids until 4:30 the morning of your surgery.   Clear liquids allowed are: Water, Non-Citrus Juices (without pulp), Carbonated Beverages, Clear Tea (no milk, honey, etc.), Black Coffee Only (NO MILK, CREAM OR POWDERED CREAMER of any kind), and Gatorade.         Patient Instructions   The night before surgery:  No food after midnight. ONLY clear liquids after midnight   The day of surgery (if you do NOT have diabetes):  Drink ONE (1) Pre-Surgery Clear Ensure by 4:30 the morning of surgery. Drink in one sitting. Do not sip.  This drink was given to you during your hospital  pre-op appointment visit.   Nothing else to drink after completing the  Pre-Surgery Clear Ensure.            If you have questions, please contact your surgeon's office.   Take these medicines the morning of surgery with A SIP OF WATER  DULoxetine (CYMBALTA) pantoprazole (PROTONIX)    May take these medicines IF NEEDED: acetaminophen (TYLENOL)      One week prior to surgery, STOP taking any Aspirin (unless otherwise instructed by your surgeon) Aleve, Naproxen, Ibuprofen, Motrin, Advil, Goody's, BC's, all herbal medications, fish oil, and non-prescription vitamins. THIS INCLUDES YOUR diclofenac (VOLTAREN), Omega-3 Fatty Acids (OMEGA 3 PO).                      Do NOT Smoke (Tobacco/Vaping) for 24 hours prior to your  procedure.   If you use a CPAP at night, you may bring your mask/headgear for your overnight stay.   You will be asked to remove any contacts, glasses, piercing's, hearing aid's, dentures/partials prior to surgery. Please bring cases for these items if needed.    Patients discharged the day of surgery will not be allowed to drive home, and someone needs to stay with them for 24 hours.   SURGICAL WAITING ROOM VISITATION Patients may have no more than 2 support people in the waiting area - these visitors may rotate.   Pre-op nurse will coordinate an appropriate time for 1 ADULT support person, who may not rotate, to accompany patient in pre-op.  Children under the age of 46 must have an adult with them who is not the patient and must remain in the main waiting area with an adult.   If the patient needs to stay at the hospital during part of their recovery, the visitor guidelines for inpatient rooms apply.   Please refer to the [facility] website for the visitor guidelines for any additional information.     If you received a COVID test during your pre-op visit  it is requested that you wear a mask when out in public, stay away from anyone that may not be feeling well and notify your surgeon if  you develop symptoms. If you have been in contact with anyone that has tested positive in the last 10 days please notify you surgeon.         Pre-operative 5 CHG Bathing Instructions    You can play a key role in reducing the risk of infection after surgery. Your skin needs to be as free of germs as possible. You can reduce the number of germs on your skin by washing with CHG (chlorhexidine gluconate) soap before surgery. CHG is an antiseptic soap that kills germs and continues to kill germs even after washing.    DO NOT use if you have an allergy to chlorhexidine/CHG or antibacterial soaps. If your skin becomes reddened or irritated, stop using the CHG and notify one of our RNs at [phone].     Please shower with the CHG soap starting 4 days before surgery using the following schedule:       Please keep in mind the following:  DO NOT shave, including legs and underarms, starting the day of your first shower.   You may shave your face at any point before/day of surgery.  Place clean sheets on your bed the day you start using CHG soap. Use a clean washcloth (not used since being washed) for each shower. DO NOT sleep with pets once you start using the CHG.    CHG Shower Instructions:  Wash your face and private area with normal soap. If you choose to wash your hair, wash first with your normal shampoo.  After you use shampoo/soap, rinse your hair and body thoroughly to remove shampoo/soap residue.  Turn the water OFF and apply about 3 tablespoons (45 ml) of CHG soap to a CLEAN washcloth.  Apply CHG soap ONLY FROM YOUR NECK DOWN TO YOUR TOES (washing for 3-5 minutes)  DO NOT use CHG soap on face, private areas, open wounds, or sores.  Pay special attention to the area where your surgery is being performed.  If you are having back surgery, having someone wash your back for you may be helpful. Wait 2 minutes after CHG soap is applied, then you may rinse off the CHG soap.  Pat dry with a clean towel  Put on clean clothes/pajamas   If you choose to wear lotion, please use ONLY the CHG-compatible lotions that are listed below.   Additional instructions for the day of surgery: DO NOT APPLY any lotions, deodorants, cologne, or perfumes.   Do not bring valuables to the hospital. [facility] is not responsible for any belongings/valuables. Do not wear nail polish, gel polish, artificial nails, or any other type of covering on natural nails (fingers and toes) Do not wear jewelry or makeup Put on clean/comfortable clothes.  Please brush your teeth.  Ask your nurse before applying any prescription medications to the skin.        CHG Compatible Lotions    Aveeno Moisturizing lotion   Cetaphil Moisturizing Cream  Cetaphil Moisturizing Lotion  Clairol Herbal Essence Moisturizing Lotion, Dry Skin  Clairol Herbal Essence Moisturizing Lotion, Extra Dry Skin  Clairol Herbal Essence Moisturizing Lotion, Normal Skin  Curel Age Defying Therapeutic Moisturizing Lotion with Alpha Hydroxy  Curel Extreme Care Body Lotion  Curel Soothing Hands Moisturizing Hand Lotion  Curel Therapeutic Moisturizing Cream, Fragrance-Free  Curel Therapeutic Moisturizing Lotion, Fragrance-Free  Curel Therapeutic Moisturizing Lotion, Original Formula  Eucerin Daily Replenishing Lotion  Eucerin Dry Skin Therapy Plus Alpha Hydroxy Crme  Eucerin Dry Skin Therapy Plus Alpha Hydroxy Lotion  Eucerin  Original Crme  Eucerin Original Lotion  Eucerin Plus Crme Eucerin Plus Lotion  Eucerin TriLipid Replenishing Lotion  Keri Anti-Bacterial Hand Lotion  Keri Deep Conditioning Original Lotion Dry Skin Formula Softly Scented  Keri Deep Conditioning Original Lotion, Fragrance Free Sensitive Skin Formula  Keri Lotion Fast Absorbing Fragrance Free Sensitive Skin Formula  Keri Lotion Fast Absorbing Softly Scented Dry Skin Formula  Keri Original Lotion  Keri Skin Renewal Lotion Keri Silky Smooth Lotion  Keri Silky Smooth Sensitive Skin Lotion  Nivea Body Creamy Conditioning Oil  Nivea Body Extra Enriched Lotion  Nivea Body Original Lotion  Nivea Body Sheer Moisturizing Lotion Nivea Crme  Nivea Skin Firming Lotion  NutraDerm 30 Skin Lotion  NutraDerm Skin Lotion  NutraDerm Therapeutic Skin Cream  NutraDerm Therapeutic Skin Lotion  ProShield Protective Hand Cream  Provon moisturizing lotion   Please read over the following fact sheets that you were given.

## 2024-05-08 ENCOUNTER — Other Ambulatory Visit (HOSPITAL_COMMUNITY)

## 2024-05-08 NOTE — Anesthesia Preprocedure Evaluation (Signed)
 Anesthesia Evaluation  Patient identified by MRN, date of birth, ID band Patient awake    Reviewed: Allergy & Precautions, NPO status , Patient's Chart, lab work & pertinent test results  Airway Mallampati: II  TM Distance: >3 FB Neck ROM: Full    Dental no notable dental hx. (+) Teeth Intact, Dental Advisory Given, Caps Orthodontic appliances:   Pulmonary neg pulmonary ROS   Pulmonary exam normal breath sounds clear to auscultation       Cardiovascular Normal cardiovascular exam+ Valvular Problems/Murmurs MVP  Rhythm:Regular Rate:Normal     Neuro/Psych  PSYCHIATRIC DISORDERS  Depression     Neuromuscular disease    GI/Hepatic negative GI ROS, Neg liver ROS,,,  Endo/Other  Hypothyroidism    Renal/GU negative Renal ROS  negative genitourinary   Musculoskeletal  (+)  Fibromyalgia -Low back pain Lumbar spinal stenosis   Abdominal   Peds  Hematology negative hematology ROS (+)   Anesthesia Other Findings   Reproductive/Obstetrics                              Anesthesia Physical Anesthesia Plan  ASA: 2  Anesthesia Plan: General   Post-op Pain Management: Minimal or no pain anticipated, Dilaudid IV and Ofirmev IV (intra-op)*   Induction: Intravenous  PONV Risk Score and Plan: 4 or greater and Treatment may vary due to age or medical condition, Ondansetron and Dexamethasone   Airway Management Planned: Oral ETT  Additional Equipment: None  Intra-op Plan:   Post-operative Plan: Extubation in OR  Informed Consent: I have reviewed the patients History and Physical, chart, labs and discussed the procedure including the risks, benefits and alternatives for the proposed anesthesia with the patient or authorized representative who has indicated his/her understanding and acceptance.     Dental advisory given  Plan Discussed with: CRNA and Anesthesiologist  Anesthesia Plan Comments: (PAT  note by Rudy Costain, PA-C: 68 year old female follows with cardiology for history of mitral valve prolapse and palpitations.  Seen by Dr. Augusta Blizzard at [facility] on 04/20/2024 for preop evaluation.  Per note, "Palpitations: Patient with rare intermittent palpitations likely from underlying prior mitral valve prolapse. Will obtain 2D echo. She should be at low risk for cardiovascular event perioperatively for her orthopedic/back surgery. She recently had coronary calcium score which was 0 which is quite reassuring." Echo 04/24/24 showed EF 60 to 65%, mild diastolic dysfunction, mitral valve prolapse with trace regurgitation  Preop labs reviewed, unremarkable. . EKG 04/20/2024 (Care Everywhere, tracing requested): Sinus rhythm.  Low voltage precordial leads.  TTE 04/24/2024 (Care Everywhere): Impression: Left Ventricle: Systolic function is normal. EF: 60-65%.    Left Ventricle: Doppler parameters consistent with mild diastolic  dysfunction and low to normal LA pressure.    Mitral Valve: There is mitral valve prolapse.    )         Anesthesia Quick Evaluation

## 2024-05-08 NOTE — Progress Notes (Signed)
 Anesthesia Chart Review:  68 year old female follows with cardiology for history of mitral valve prolapse and palpitations.  Seen by Dr. Augusta Blizzard at [facility] on 04/20/2024 for preop evaluation.  Per note, "Palpitations: Patient with rare intermittent palpitations likely from underlying prior mitral valve prolapse. Will obtain 2D echo. She should be at low risk for cardiovascular event perioperatively for her orthopedic/back surgery. She recently had coronary calcium score which was 0 which is quite reassuring." Echo 04/24/24 showed EF 60 to 65%, mild diastolic dysfunction, mitral valve prolapse with trace regurgitation  Preop labs reviewed, unremarkable. . EKG 04/20/2024 (Care Everywhere, tracing requested): Sinus rhythm.  Low voltage precordial leads.  TTE 04/24/2024 (Care Everywhere): Impression: Left Ventricle: Systolic function is normal. EF: 60-65%.    Left Ventricle: Doppler parameters consistent with mild diastolic  dysfunction and low to normal LA pressure.    Mitral Valve: There is mitral valve prolapse.     [facility] Short Stay Center/Anesthesiology Phone [phone] 05/08/2024 3:04 PM

## 2024-05-09 DIAGNOSIS — L719 Rosacea, unspecified: Secondary | ICD-10-CM | POA: Diagnosis not present

## 2024-05-09 DIAGNOSIS — L820 Inflamed seborrheic keratosis: Secondary | ICD-10-CM | POA: Diagnosis not present

## 2024-05-18 ENCOUNTER — Ambulatory Visit (HOSPITAL_COMMUNITY)
Admission: RE | Admit: 2024-05-18 | Discharge: 2024-05-19 | Disposition: A | Discharge status: Home / Self Care | Attending: Specialist | Admitting: Specialist

## 2024-05-18 ENCOUNTER — Other Ambulatory Visit: Payer: Self-pay

## 2024-05-18 ENCOUNTER — Encounter (HOSPITAL_COMMUNITY): Payer: Self-pay | Admitting: Specialist

## 2024-05-18 ENCOUNTER — Ambulatory Visit (HOSPITAL_COMMUNITY)

## 2024-05-18 ENCOUNTER — Ambulatory Visit (HOSPITAL_BASED_OUTPATIENT_CLINIC_OR_DEPARTMENT_OTHER): Payer: Self-pay | Admitting: Anesthesiology

## 2024-05-18 ENCOUNTER — Encounter (HOSPITAL_COMMUNITY)
Admission: RE | Disposition: A | Discharge status: Home / Self Care | Payer: Self-pay | Source: Home / Self Care | Attending: Specialist

## 2024-05-18 ENCOUNTER — Ambulatory Visit (HOSPITAL_COMMUNITY): Payer: Self-pay | Admitting: Physician Assistant

## 2024-05-18 DIAGNOSIS — M4316 Spondylolisthesis, lumbar region: Secondary | ICD-10-CM | POA: Diagnosis not present

## 2024-05-18 DIAGNOSIS — W19XXXA Unspecified fall, initial encounter: Secondary | ICD-10-CM | POA: Insufficient documentation

## 2024-05-18 DIAGNOSIS — M48061 Spinal stenosis, lumbar region without neurogenic claudication: Secondary | ICD-10-CM | POA: Diagnosis not present

## 2024-05-18 DIAGNOSIS — E039 Hypothyroidism, unspecified: Secondary | ICD-10-CM | POA: Insufficient documentation

## 2024-05-18 DIAGNOSIS — F32A Depression, unspecified: Secondary | ICD-10-CM | POA: Insufficient documentation

## 2024-05-18 DIAGNOSIS — M7138 Other bursal cyst, other site: Secondary | ICD-10-CM | POA: Diagnosis not present

## 2024-05-18 DIAGNOSIS — M545 Low back pain, unspecified: Secondary | ICD-10-CM | POA: Diagnosis present

## 2024-05-18 DIAGNOSIS — G9519 Other vascular myelopathies: Secondary | ICD-10-CM | POA: Diagnosis not present

## 2024-05-18 DIAGNOSIS — M797 Fibromyalgia: Secondary | ICD-10-CM | POA: Diagnosis not present

## 2024-05-18 DIAGNOSIS — M5116 Intervertebral disc disorders with radiculopathy, lumbar region: Secondary | ICD-10-CM | POA: Diagnosis not present

## 2024-05-18 DIAGNOSIS — Z981 Arthrodesis status: Secondary | ICD-10-CM | POA: Diagnosis not present

## 2024-05-18 DIAGNOSIS — Z01818 Encounter for other preprocedural examination: Secondary | ICD-10-CM

## 2024-05-18 HISTORY — PX: LUMBAR LAMINECTOMY/DECOMPRESSION MICRODISCECTOMY: SHX5026

## 2024-05-18 SURGERY — LUMBAR LAMINECTOMY/DECOMPRESSION MICRODISCECTOMY 1 LEVEL
Anesthesia: General | Site: Spine Lumbar | Laterality: Right

## 2024-05-18 MED ORDER — KCL IN DEXTROSE-NACL 20-5-0.45 MEQ/L-%-% IV SOLN
INTRAVENOUS | Status: DC
  Filled 2024-05-18 (×2): qty 1000

## 2024-05-18 MED ORDER — ONDANSETRON HCL 4 MG/2ML IJ SOLN
4.0000 mg | Freq: Once | INTRAMUSCULAR | Status: DC | PRN
Start: 2024-05-18 — End: 2024-05-18

## 2024-05-18 MED ORDER — METHOCARBAMOL 500 MG PO TABS
500.0000 mg | ORAL_TABLET | Freq: Four times a day (QID) | ORAL | Status: DC | PRN
  Administered 2024-05-18 – 2024-05-19 (×2): 500 mg via ORAL
  Filled 2024-05-18 (×3): qty 1

## 2024-05-18 MED ORDER — HYDROMORPHONE HCL 1 MG/ML IJ SOLN: 0.5000 mg | INTRAMUSCULAR | Status: DC | PRN

## 2024-05-18 MED ORDER — BUPIVACAINE-EPINEPHRINE 0.5% -1:200000 IJ SOLN
INTRAMUSCULAR | Status: DC | PRN
  Administered 2024-05-18: 3 mL

## 2024-05-18 MED ORDER — MIDAZOLAM HCL 2 MG/2ML IJ SOLN
INTRAMUSCULAR | Status: DC | PRN
  Administered 2024-05-18: 1 mg via INTRAVENOUS

## 2024-05-18 MED ORDER — TRANEXAMIC ACID-NACL 1000-0.7 MG/100ML-% IV SOLN
1000.0000 mg | INTRAVENOUS | Status: AC
  Administered 2024-05-18: 1000 mg via INTRAVENOUS
  Filled 2024-05-18: qty 100

## 2024-05-18 MED ORDER — DULOXETINE HCL 30 MG PO CPEP: 30.0000 mg | ORAL_CAPSULE | Freq: Every morning | ORAL | Status: DC

## 2024-05-18 MED ORDER — CEFAZOLIN SODIUM-DEXTROSE 2-4 GM/100ML-% IV SOLN
2.0000 g | INTRAVENOUS | Status: AC
  Administered 2024-05-18: 2 g via INTRAVENOUS
  Filled 2024-05-18: qty 100

## 2024-05-18 MED ORDER — DOCUSATE SODIUM 100 MG PO CAPS: 100.0000 mg | ORAL_CAPSULE | Freq: Two times a day (BID) | ORAL | 2 refills | Status: AC

## 2024-05-18 MED ORDER — 0.9 % SODIUM CHLORIDE (POUR BTL) OPTIME
TOPICAL | Status: DC | PRN
Start: 2024-05-18 — End: 2024-05-18
  Administered 2024-05-18: 1000 mL

## 2024-05-18 MED ORDER — EPHEDRINE SULFATE-NACL 50-0.9 MG/10ML-% IV SOSY
PREFILLED_SYRINGE | INTRAVENOUS | Status: DC | PRN
  Administered 2024-05-18 (×2): 5 mg via INTRAVENOUS

## 2024-05-18 MED ORDER — ROCURONIUM BROMIDE 10 MG/ML (PF) SYRINGE
PREFILLED_SYRINGE | INTRAVENOUS | Status: DC | PRN
  Administered 2024-05-18: 70 mg via INTRAVENOUS

## 2024-05-18 MED ORDER — OXYCODONE HCL 5 MG PO TABS
5.0000 mg | ORAL_TABLET | Freq: Once | ORAL | Status: AC | PRN
  Administered 2024-05-18: 5 mg via ORAL

## 2024-05-18 MED ORDER — EPHEDRINE 5 MG/ML INJ
INTRAVENOUS | Status: AC
  Filled 2024-05-18: qty 5

## 2024-05-18 MED ORDER — MIDAZOLAM HCL 2 MG/2ML IJ SOLN
INTRAMUSCULAR | Status: AC
  Filled 2024-05-18: qty 2

## 2024-05-18 MED ORDER — OXYCODONE HCL 5 MG PO TABS: 5.0000 mg | ORAL_TABLET | ORAL | Status: DC | PRN

## 2024-05-18 MED ORDER — LIDOCAINE 2% (20 MG/ML) 5 ML SYRINGE
INTRAMUSCULAR | Status: AC
  Filled 2024-05-18: qty 5

## 2024-05-18 MED ORDER — PANTOPRAZOLE SODIUM 40 MG PO TBEC: 40.0000 mg | DELAYED_RELEASE_TABLET | Freq: Every day | ORAL | Status: DC

## 2024-05-18 MED ORDER — POLYETHYLENE GLYCOL 3350 17 G PO PACK: 17.0000 g | PACK | Freq: Every day | ORAL | 0 refills | Status: AC

## 2024-05-18 MED ORDER — DEXAMETHASONE SODIUM PHOSPHATE 10 MG/ML IJ SOLN
INTRAMUSCULAR | Status: AC
  Filled 2024-05-18: qty 1

## 2024-05-18 MED ORDER — LACTATED RINGERS IV SOLN: INTRAVENOUS | Status: DC

## 2024-05-18 MED ORDER — PROPOFOL 10 MG/ML IV BOLUS
INTRAVENOUS | Status: DC | PRN
  Administered 2024-05-18: 150 mg via INTRAVENOUS

## 2024-05-18 MED ORDER — KCL IN DEXTROSE-NACL 20-5-0.9 MEQ/L-%-% IV SOLN
INTRAVENOUS | Status: DC
Start: 2024-05-18 — End: 2024-05-19
  Filled 2024-05-18: qty 1000

## 2024-05-18 MED ORDER — RISAQUAD PO CAPS
1.0000 | ORAL_CAPSULE | Freq: Every day | ORAL | Status: DC
  Administered 2024-05-18: 1 via ORAL
  Filled 2024-05-18: qty 1

## 2024-05-18 MED ORDER — DROPERIDOL 2.5 MG/ML IJ SOLN: 0.6250 mg | Freq: Once | INTRAMUSCULAR | Status: DC | PRN

## 2024-05-18 MED ORDER — METHOCARBAMOL 1000 MG/10ML IJ SOLN: 500.0000 mg | Freq: Four times a day (QID) | INTRAMUSCULAR | Status: DC | PRN

## 2024-05-18 MED ORDER — PHENYLEPHRINE 80 MCG/ML (10ML) SYRINGE FOR IV PUSH (FOR BLOOD PRESSURE SUPPORT)
PREFILLED_SYRINGE | INTRAVENOUS | Status: AC
  Filled 2024-05-18: qty 10

## 2024-05-18 MED ORDER — ALUM & MAG HYDROXIDE-SIMETH 200-200-20 MG/5ML PO SUSP: 30.0000 mL | Freq: Four times a day (QID) | ORAL | Status: DC | PRN

## 2024-05-18 MED ORDER — PSYLLIUM 95 % PO PACK
1.0000 | PACK | Freq: Every day | ORAL | Status: DC
  Filled 2024-05-18: qty 1

## 2024-05-18 MED ORDER — POLYETHYLENE GLYCOL 3350 17 G PO PACK
17.0000 g | PACK | Freq: Every day | ORAL | Status: DC | PRN
  Administered 2024-05-18: 17 g via ORAL
  Filled 2024-05-18: qty 1

## 2024-05-18 MED ORDER — DEXAMETHASONE SODIUM PHOSPHATE 10 MG/ML IJ SOLN
INTRAMUSCULAR | Status: DC | PRN
  Administered 2024-05-18: 5 mg via INTRAVENOUS

## 2024-05-18 MED ORDER — METHOCARBAMOL 500 MG PO TABS: 500.0000 mg | ORAL_TABLET | Freq: Three times a day (TID) | ORAL | 1 refills | Status: AC | PRN

## 2024-05-18 MED ORDER — OXYCODONE HCL 5 MG PO TABS
ORAL_TABLET | ORAL | Status: AC
Start: 2024-05-18 — End: ?
  Filled 2024-05-18: qty 1

## 2024-05-18 MED ORDER — PHENYLEPHRINE 80 MCG/ML (10ML) SYRINGE FOR IV PUSH (FOR BLOOD PRESSURE SUPPORT)
PREFILLED_SYRINGE | INTRAVENOUS | Status: DC | PRN
  Administered 2024-05-18: 160 ug via INTRAVENOUS
  Administered 2024-05-18 (×2): 40 ug via INTRAVENOUS
  Administered 2024-05-18: 80 ug via INTRAVENOUS
  Administered 2024-05-18: 160 ug via INTRAVENOUS
  Administered 2024-05-18: 80 ug via INTRAVENOUS
  Administered 2024-05-18: 160 ug via INTRAVENOUS

## 2024-05-18 MED ORDER — SUGAMMADEX SODIUM 200 MG/2ML IV SOLN
INTRAVENOUS | Status: DC | PRN
  Administered 2024-05-18: 150 mg via INTRAVENOUS

## 2024-05-18 MED ORDER — ESCITALOPRAM OXALATE 10 MG PO TABS: 10.0000 mg | ORAL_TABLET | Freq: Every day | ORAL | Status: DC

## 2024-05-18 MED ORDER — BISACODYL 5 MG PO TBEC: 5.0000 mg | DELAYED_RELEASE_TABLET | Freq: Every day | ORAL | Status: DC | PRN

## 2024-05-18 MED ORDER — LIOTHYRONINE SODIUM 5 MCG PO TABS
5.0000 ug | ORAL_TABLET | Freq: Every day | ORAL | Status: DC
  Filled 2024-05-18: qty 1

## 2024-05-18 MED ORDER — ROCURONIUM BROMIDE 10 MG/ML (PF) SYRINGE
PREFILLED_SYRINGE | INTRAVENOUS | Status: AC
  Filled 2024-05-18: qty 10

## 2024-05-18 MED ORDER — PHENOL 1.4 % MT LIQD: 1.0000 | OROMUCOSAL | Status: DC | PRN

## 2024-05-18 MED ORDER — ONDANSETRON HCL 4 MG/2ML IJ SOLN: 4.0000 mg | Freq: Four times a day (QID) | INTRAMUSCULAR | Status: DC | PRN

## 2024-05-18 MED ORDER — THROMBIN 20000 UNITS EX SOLR
CUTANEOUS | Status: AC
  Filled 2024-05-18: qty 20000

## 2024-05-18 MED ORDER — HYDROMORPHONE HCL 1 MG/ML IJ SOLN
INTRAMUSCULAR | Status: AC
Start: 2024-05-18 — End: ?
  Filled 2024-05-18: qty 1

## 2024-05-18 MED ORDER — CHLORHEXIDINE GLUCONATE 0.12 % MT SOLN
15.0000 mL | Freq: Once | OROMUCOSAL | Status: AC
  Administered 2024-05-18: 15 mL via OROMUCOSAL
  Filled 2024-05-18: qty 15

## 2024-05-18 MED ORDER — VITAMIN D3 25 MCG (1000 UNIT) PO TABS
4000.0000 [IU] | ORAL_TABLET | Freq: Every day | ORAL | Status: DC
  Filled 2024-05-18: qty 4

## 2024-05-18 MED ORDER — ONDANSETRON HCL 4 MG PO TABS: 4.0000 mg | ORAL_TABLET | Freq: Four times a day (QID) | ORAL | Status: DC | PRN

## 2024-05-18 MED ORDER — MENTHOL 3 MG MT LOZG: 1.0000 | LOZENGE | OROMUCOSAL | Status: DC | PRN

## 2024-05-18 MED ORDER — DOCUSATE SODIUM 100 MG PO CAPS
100.0000 mg | ORAL_CAPSULE | Freq: Two times a day (BID) | ORAL | Status: DC
  Administered 2024-05-18: 100 mg via ORAL
  Filled 2024-05-18: qty 1

## 2024-05-18 MED ORDER — PROPOFOL 10 MG/ML IV BOLUS
INTRAVENOUS | Status: AC
  Filled 2024-05-18: qty 20

## 2024-05-18 MED ORDER — BUPIVACAINE-EPINEPHRINE (PF) 0.5% -1:200000 IJ SOLN
INTRAMUSCULAR | Status: AC
  Filled 2024-05-18: qty 30

## 2024-05-18 MED ORDER — ONDANSETRON HCL 4 MG/2ML IJ SOLN
INTRAMUSCULAR | Status: DC | PRN
  Administered 2024-05-18: 4 mg via INTRAVENOUS

## 2024-05-18 MED ORDER — ORAL CARE MOUTH RINSE: 15.0000 mL | Freq: Once | OROMUCOSAL | Status: AC

## 2024-05-18 MED ORDER — ACETAMINOPHEN 10 MG/ML IV SOLN
1000.0000 mg | INTRAVENOUS | Status: AC
  Administered 2024-05-18: 1000 mg via INTRAVENOUS
  Filled 2024-05-18: qty 100

## 2024-05-18 MED ORDER — MAGNESIUM CITRATE PO SOLN: 1.0000 | Freq: Once | ORAL | Status: DC | PRN

## 2024-05-18 MED ORDER — LIDOCAINE 2% (20 MG/ML) 5 ML SYRINGE
INTRAMUSCULAR | Status: DC | PRN
  Administered 2024-05-18: 60 mg via INTRAVENOUS

## 2024-05-18 MED ORDER — LEVOTHYROXINE SODIUM 25 MCG PO TABS
50.0000 ug | ORAL_TABLET | Freq: Every day | ORAL | Status: DC
  Filled 2024-05-18: qty 2

## 2024-05-18 MED ORDER — OXYCODONE HCL 5 MG PO TABS
10.0000 mg | ORAL_TABLET | ORAL | Status: DC | PRN
  Administered 2024-05-18 (×2): 10 mg via ORAL
  Filled 2024-05-18 (×2): qty 2

## 2024-05-18 MED ORDER — ACETAMINOPHEN 325 MG PO TABS: 650.0000 mg | ORAL_TABLET | ORAL | Status: DC | PRN

## 2024-05-18 MED ORDER — FENTANYL CITRATE (PF) 250 MCG/5ML IJ SOLN
INTRAMUSCULAR | Status: DC | PRN
  Administered 2024-05-18: 50 ug via INTRAVENOUS
  Administered 2024-05-18: 100 ug via INTRAVENOUS

## 2024-05-18 MED ORDER — FENTANYL CITRATE (PF) 250 MCG/5ML IJ SOLN
INTRAMUSCULAR | Status: AC
  Filled 2024-05-18: qty 5

## 2024-05-18 MED ORDER — OXYCODONE HCL 5 MG PO TABS: 5.0000 mg | ORAL_TABLET | ORAL | 0 refills | Status: AC | PRN

## 2024-05-18 MED ORDER — CEFAZOLIN SODIUM-DEXTROSE 2-4 GM/100ML-% IV SOLN
2.0000 g | Freq: Three times a day (TID) | INTRAVENOUS | Status: AC
  Administered 2024-05-18 (×2): 2 g via INTRAVENOUS
  Filled 2024-05-18 (×2): qty 100

## 2024-05-18 MED ORDER — PHENYLEPHRINE HCL-NACL 20-0.9 MG/250ML-% IV SOLN
INTRAVENOUS | Status: DC | PRN
Start: 2024-05-18 — End: 2024-05-18
  Administered 2024-05-18: 25 ug/min via INTRAVENOUS

## 2024-05-18 MED ORDER — OXYCODONE HCL 5 MG/5ML PO SOLN: 5.0000 mg | Freq: Once | ORAL | Status: AC | PRN

## 2024-05-18 MED ORDER — ONDANSETRON HCL 4 MG/2ML IJ SOLN
INTRAMUSCULAR | Status: AC
  Filled 2024-05-18: qty 2

## 2024-05-18 MED ORDER — HYDROMORPHONE HCL 1 MG/ML IJ SOLN
0.2500 mg | INTRAMUSCULAR | Status: DC | PRN
  Administered 2024-05-18: 0.5 mg via INTRAVENOUS
  Administered 2024-05-18 (×2): 0.25 mg via INTRAVENOUS

## 2024-05-18 MED ORDER — THROMBIN 20000 UNITS EX SOLR
CUTANEOUS | Status: DC | PRN
  Administered 2024-05-18: 20 mL via TOPICAL

## 2024-05-18 MED ORDER — ACETAMINOPHEN 650 MG RE SUPP: 650.0000 mg | RECTAL | Status: DC | PRN

## 2024-05-18 SURGICAL SUPPLY — 51 items
BAG COUNTER SPONGE SURGICOUNT (BAG) ×1 IMPLANT
BAG DECANTER FOR FLEXI CONT (MISCELLANEOUS) IMPLANT
BAND RUBBER #18 3X1/16 STRL (MISCELLANEOUS) ×2 IMPLANT
BUR EGG ELITE 5.0 (BURR) IMPLANT
BUR RND DIAMOND ELITE 4.0 (BURR) IMPLANT
CLEANER TIP ELECTROSURG 2X2 (MISCELLANEOUS) ×1 IMPLANT
CNTNR URN SCR LID CUP LEK RST (MISCELLANEOUS) ×1 IMPLANT
DRAPE LAPAROTOMY 100X72X124 (DRAPES) ×1 IMPLANT
DRAPE MICROSCOPE SLANT 54X150 (MISCELLANEOUS) ×1 IMPLANT
DRAPE SHEET LG 3/4 BI-LAMINATE (DRAPES) ×1 IMPLANT
DRAPE SURG 17X11 SM STRL (DRAPES) ×1 IMPLANT
DRAPE UTILITY XL STRL (DRAPES) ×1 IMPLANT
DRESSING AQUACEL AG SP 3.5X4 (GAUZE/BANDAGES/DRESSINGS) IMPLANT
DRESSING AQUACEL AG SP 3.5X6 (GAUZE/BANDAGES/DRESSINGS) IMPLANT
DRSG AQUACEL AG ADV 3.5X 4 (GAUZE/BANDAGES/DRESSINGS) IMPLANT
DRSG AQUACEL AG ADV 3.5X 6 (GAUZE/BANDAGES/DRESSINGS) IMPLANT
DRSG TELFA 3X8 NADH STRL (GAUZE/BANDAGES/DRESSINGS) IMPLANT
DURAPREP 26ML APPLICATOR (WOUND CARE) ×1 IMPLANT
DURASEAL SPINE SEALANT 3ML (MISCELLANEOUS) IMPLANT
ELECTRODE BLDE 4.0 EZ CLN MEGD (MISCELLANEOUS) IMPLANT
ELECTRODE REM PT RTRN 9FT ADLT (ELECTROSURGICAL) ×1 IMPLANT
GLOVE BIOGEL PI IND STRL 7.5 (GLOVE) ×1 IMPLANT
GLOVE SURG SS PI 7.0 STRL IVOR (GLOVE) ×1 IMPLANT
GLOVE SURG SS PI 8.0 STRL IVOR (GLOVE) ×2 IMPLANT
GOWN STRL REUS W/ TWL LRG LVL3 (GOWN DISPOSABLE) ×1 IMPLANT
GOWN STRL REUS W/ TWL XL LVL3 (GOWN DISPOSABLE) ×1 IMPLANT
IV CATH 14GX2 1/4 (CATHETERS) ×1 IMPLANT
KIT BASIN OR (CUSTOM PROCEDURE TRAY) ×1 IMPLANT
NDL 22X1.5 STRL (OR ONLY) (MISCELLANEOUS) ×1 IMPLANT
NDL SPNL 18GX3.5 QUINCKE PK (NEEDLE) ×2 IMPLANT
NEEDLE 22X1.5 STRL (OR ONLY) (MISCELLANEOUS) ×1 IMPLANT
NEEDLE SPNL 18GX3.5 QUINCKE PK (NEEDLE) ×2 IMPLANT
PACK LAMINECTOMY NEURO (CUSTOM PROCEDURE TRAY) ×1 IMPLANT
PATTIES SURGICAL .75X.75 (GAUZE/BANDAGES/DRESSINGS) ×1 IMPLANT
SOLUTION PRONTOSAN WOUND 350ML (IRRIGATION / IRRIGATOR) IMPLANT
SPONGE SURGIFOAM ABS GEL 100 (HEMOSTASIS) ×1 IMPLANT
SPONGE T-LAP 4X18 ~~LOC~~+RFID (SPONGE) IMPLANT
STAPLER VISISTAT (STAPLE) IMPLANT
STRIP CLOSURE SKIN 1/2X4 (GAUZE/BANDAGES/DRESSINGS) ×1 IMPLANT
SUT NURALON 4 0 TR CR/8 (SUTURE) IMPLANT
SUT PROLENE 3 0 PS 2 (SUTURE) IMPLANT
SUT VIC AB 1 CT1 27XBRD ANTBC (SUTURE) IMPLANT
SUT VIC AB 1-0 CT2 27 (SUTURE) IMPLANT
SUT VIC AB 2-0 CT1 TAPERPNT 27 (SUTURE) IMPLANT
SUT VIC AB 2-0 CT2 27 (SUTURE) IMPLANT
SYR 3ML LL SCALE MARK (SYRINGE) ×1 IMPLANT
TOWEL GREEN STERILE (TOWEL DISPOSABLE) ×1 IMPLANT
TOWEL GREEN STERILE FF (TOWEL DISPOSABLE) ×1 IMPLANT
TRAY FOLEY MTR SLVR 16FR STAT (SET/KITS/TRAYS/PACK) ×1 IMPLANT
WIPE CHG 2% 2PK PREOPERATIVE (MISCELLANEOUS) ×1 IMPLANT
YANKAUER SUCT BULB TIP NO VENT (SUCTIONS) ×1 IMPLANT

## 2024-05-18 NOTE — Brief Op Note (Signed)
 05/18/2024  7:24 AM  PATIENT:  Tracy Francis  68 y.o. 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  POST-OPERATIVE DIAGNOSIS:  * No post-op diagnosis entered *  PROCEDURE:  Procedure(s) with comments: LUMBAR LAMINECTOMY/DECOMPRESSION MICRODISCECTOMY 1 LEVEL (N/A) - Right hemilaminotomy L4-5, excision of synovial cyst  SURGEON:  Surgeons and Role:    Orvan Blanch, MD - Primary  PHYSICIAN ASSISTANT:   ASSISTANTS: Bissell   ANESTHESIA:   general  EBL:  50     BLOOD ADMINISTERED:none  DRAINS: none   LOCAL MEDICATIONS USED:  MARCAINE     SPECIMEN:  No Specimen  DISPOSITION OF SPECIMEN:  N/A  COUNTS:  YES  TOURNIQUET:  * No tourniquets in log *  DICTATION: .Other Dictation: Dictation Number [id number]  PLAN OF CARE: Admit for overnight observation  PATIENT DISPOSITION:  PACU - hemodynamically stable.   Delay start of Pharmacological VTE agent (>24hrs) due to surgical blood loss or risk of bleeding: yes

## 2024-05-18 NOTE — Anesthesia Postprocedure Evaluation (Signed)
 Anesthesia Post Note  Patient: Tracy Francis  Procedure(s) Performed: LUMBAR LAMINECTOMY/DECOMPRESSION MICRODISCECTOMY LUMBAR FOUR-FIVE, EXCISION OF SYNOVIAL CYST (Right: Spine Lumbar)     Patient location during evaluation: PACU Anesthesia Type: General Level of consciousness: awake and alert and oriented Pain management: pain level controlled Vital Signs Assessment: post-procedure vital signs reviewed and stable Respiratory status: spontaneous breathing, nonlabored ventilation and respiratory function stable Cardiovascular status: blood pressure returned to baseline and stable Postop Assessment: no apparent nausea or vomiting Anesthetic complications: no   No notable events documented.  Last Vitals:  Vitals:   05/18/24 1115 05/18/24 1130  BP: 111/64 (!) 120/56  Pulse: 83 88  Resp: 10 14  Temp: 36.6 C   SpO2: 96% 95%    Last Pain:  Vitals:   05/18/24 1100  TempSrc:   PainSc: 4                  Marelly Wehrman A.

## 2024-05-18 NOTE — Transfer of Care (Signed)
 Immediate Anesthesia Transfer of Care Note  Patient: Tracy Francis  Procedure(s) Performed: LUMBAR LAMINECTOMY/DECOMPRESSION MICRODISCECTOMY LUMBAR FOUR-FIVE, EXCISION OF SYNOVIAL CYST (Right: Spine Lumbar)  Patient Location: PACU  Anesthesia Type:General  Level of Consciousness: drowsy and patient cooperative  Airway & Oxygen Therapy: Patient Spontanous Breathing and Patient connected to nasal cannula oxygen  Post-op Assessment: Report given to RN and Post -op Vital signs reviewed and stable  Post vital signs: Reviewed and stable  Last Vitals:  Vitals Value Taken Time  BP 117/55 05/18/24 1023  Temp    Pulse 94 05/18/24 1024  Resp 20 05/18/24 1024  SpO2 96 % 05/18/24 1024  Vitals shown include unfiled device data.  Last Pain:  Vitals:   05/18/24 0615  TempSrc:   PainSc: 7       Patients Stated Pain Goal: 0 (05/18/24 0615)  Complications: No notable events documented.

## 2024-05-18 NOTE — Anesthesia Procedure Notes (Signed)
 Procedure Name: Intubation Date/Time: 05/18/2024 7:46 AM  Performed by: Grier Leber, CRNAPre-anesthesia Checklist: Patient identified, Emergency Drugs available, Suction available and Patient being monitored Patient Re-evaluated:Patient Re-evaluated prior to induction Oxygen Delivery Method: Circle System Utilized Preoxygenation: Pre-oxygenation with 100% oxygen Induction Type: IV induction Ventilation: Mask ventilation without difficulty Laryngoscope Size: Mac and 3 Grade View: Grade I Tube type: Oral Tube size: 7.0 mm Number of attempts: 1 Airway Equipment and Method: Stylet Placement Confirmation: ETT inserted through vocal cords under direct vision, positive ETCO2 and breath sounds checked- equal and bilateral Secured at: 21 cm Tube secured with: Tape Dental Injury: Teeth and Oropharynx as per pre-operative assessment

## 2024-05-18 NOTE — Op Note (Signed)
 Tracy Francis, [place] MEDICAL RECORD NO: [id number] ACCOUNT NO: [id number] DATE OF BIRTH: 09-01-1956 FACILITY: [facility] LOCATION: [facility] PHYSICIAN: Loel Ring, MD  Operative Report   DATE OF PROCEDURE: 05/18/2024  PREOPERATIVE DIAGNOSES: 1.  Synovial cyst L4-L5 right. 2.  Spinal stenosis L4-L5 right with spondylolisthesis L4-L5 right.  POSTOPERATIVE DIAGNOSES: 1.  Synovial cyst L4-L5 right. 2.  Spinal stenosis L4-L5 right with spondylolisthesis L4-L5 right. 3.  Extensive epidural venous plexus L4-L5 right.  ANESTHESIA:  General.  ASSISTANT:  Amy Kansky, PA-C.  PROCEDURE PERFORMED: 1.  Hemilaminotomy at L4-L5 right with foraminotomies L4 and L5 with partial medial hemifacetectomy, L4-L5. 2.  Excision of synovial cyst L4-L5 right. 3.  Extensive epidural venous plexus.  HISTORY:  A 68 year old female with right lower extremity radicular pain, L5 nerve root distribution, EHL weakness, had a synovial cyst at L4-L5 emanating from the L4-L5 facet into the lateral recess in the foramen of L4.  The patient had severe  refractory pain in the buttock and into the leg.  She was indicated for decompression by hemilaminotomies, foraminotomies, excision of synovial cyst and decompression of the lateral recess.  Risks and benefits were discussed including bleeding,  infection, damage to neurovascular structures, no change in symptoms or worsening symptoms, DVT, PE, anesthetic complications, etc.  Also, possibility of needing a fusion in the future if she had a minimal spondylolisthesis.  The patient had minimal back  pain, predominantly buttock and leg pain.  DESCRIPTION OF PROCEDURE:  The patient was placed in the supine position.  After induction of adequate general anesthesia, 2 g of Keflex was given and the patient was placed prone on the Wilson frame.  All bony prominences were well padded.  Lumbar  region was prepped and draped in the usual sterile fashion.  Two 18-gauge spinal  needles were utilized to localize the L4-L5 interspace confirmed with x-ray.  Incision was made for the spinous process of L4 to L5.  Subcutaneous tissue was dissected.   Electrocautery was utilized to achieve hemostasis.  The dorsal lumbar fascia was divided in line with the skin incision.  Paraspinous muscle was elevated from the lamina of L4-L5 on the right, the operating microscope was draped and brought onto the  surgical field.  Confirmatory radiographs were obtained with a [place] in the interspace of L4-L5.  It was a small interlaminar window.  I used a electrocautery and a straight curette to skeletonize the inferior portion of the lamina of L4 extending to  the medial aspect of the inferior articulating process of L4.  Also the hemilamina of L5.  Next, we used high-speed burr to perform a hemilaminotomy of the caudad edge of L5 cephalad, preserving ample pars.  I then continued with a 2 mm Kerrison to the  point of detaching the ligamentum flavum.  I then used a straight curette to identify the superior articulating process of L5 and its medial edge and its superior extension.  This identified the L4 foramen and the L5 foramen.  I performed the  foraminotomy of L5.  Hypertrophic ligamentum was noted.  I used the [place] to enter the epidural space.  I decompress the lateral recess to the medial border of the pedicle, protecting the neural elements at all times.  I removed ligamentum flavum  partially from the interspace.  The patient had an extensive epidural venous plexus noted and a synovial cyst extending out of the facet at L4-L5 into the lateral recess.  I used a micropituitary and developed the plane.  I used Penfield and a [place] to  develop the plane between the cyst and the thecal sac.  While decompressing the lateral recess, we used a 2 mm Kerrison to excise the cyst that extended up to the L4 foramen.  This was sent for pathology.  It had a normal synovial cyst appearance.  The   extensive venous plexus extended out the foramen of L5 and out the foramen of L4; a large vessel out the foramen of L4.  We had protected the neural elements during the foraminotomies.  There was a portion of the undersurface of the lamina that was  adhered to the lateral aspect of the thecal sac.  When detached the portion of it from the cephalad edge of L5, when I made attempts to remove that portion cephalad at the level of the L4 foramen, there was epidural venous bleeding.  This was controlled  by neural paddies, placed cephalad and caudad, electrocautery, and Thrombin-soaked Gelfoam.  After multiple attempts, I felt that removing this metaplastic membrane on the lateral aspect of the thecal sac would only lead to more epidural bleeding and  this was then left intact.  A vein extending out the L4 foramen, which was fairly large, was left intact as well.  We had performed the foraminotomy for the decompression.  I was able to utilize the [place] and pass it out the foramen of L4 and it was  widely patent and out the L5 foramen, which was widely patent.  No evidence of disc herniation was noted.  Again, I felt any further manipulation of the plexus would lead to recurrent bleeding.  I copiously irrigated.  I performed a Valsalva at 40.  No  evidence of CSF leakage or active bleeding.  Bone wax was placed on the cancellous surfaces.  I obtained confirmatory radiograph with [facility] in the foramen of L4 and L5.  I felt it was a satisfactory decompression.  The synovial cyst was sent to  pathology.  Next, the [facility] retractor was removed.  We achieved strict hemostasis. Irrigated with paraspinous musculature.  I placed two 2 mm x 2 mm Gelfoam over the cauterized venous plexus and over the venous plexus extending into the foramen.  I closed the  dorsal lumbar fascia with 1-0 Vicryl interrupted figure-of-eight sutures, subcutaneous with 2-0 and skin with subcuticular Prolene.  Sterile dressing was  applied.  The patient was placed supine on the hospital bed, extubated without difficulty, and  transported to the recovery room in satisfactory condition.  The patient tolerated the procedure well with no complications.  Assistant Amy Kansky, PA-C was used throughout the case for the patient positioning, gentle intermittent neural traction, suction, and closure.  BLOOD LOSS:  50 mL.   PUS D: 05/18/2024 10:29:39 am T: 05/18/2024 11:29:00 am  JOB: [id number]/ [id number]

## 2024-05-18 NOTE — Discharge Instructions (Signed)

## 2024-05-18 NOTE — Interval H&P Note (Signed)
 History and Physical Interval Note:  05/18/2024 7:33 AM  Tracy Francis  has presented today for surgery, with the diagnosis of Stenosis, synovial cyst L4-5 right.  The various methods of treatment have been discussed with the patient and family. After consideration of risks, benefits and other options for treatment, the patient has consented to  Procedure(s) with comments: LUMBAR LAMINECTOMY/DECOMPRESSION MICRODISCECTOMY 1 LEVEL (N/A) - Right hemilaminotomy L4-5, excision of synovial cyst as a surgical intervention.  The patient's history has been reviewed, patient examined, no change in status, stable for surgery.  I have reviewed the patient's chart and labs.  Questions were answered to the patient's satisfaction.     Loel Ring

## 2024-05-19 ENCOUNTER — Encounter (HOSPITAL_COMMUNITY): Payer: Self-pay | Admitting: Specialist

## 2024-05-19 DIAGNOSIS — M7138 Other bursal cyst, other site: Secondary | ICD-10-CM | POA: Diagnosis not present

## 2024-05-19 DIAGNOSIS — M48061 Spinal stenosis, lumbar region without neurogenic claudication: Secondary | ICD-10-CM | POA: Diagnosis not present

## 2024-05-19 DIAGNOSIS — M4316 Spondylolisthesis, lumbar region: Secondary | ICD-10-CM | POA: Diagnosis not present

## 2024-05-19 DIAGNOSIS — M797 Fibromyalgia: Secondary | ICD-10-CM | POA: Diagnosis not present

## 2024-05-19 DIAGNOSIS — E039 Hypothyroidism, unspecified: Secondary | ICD-10-CM | POA: Diagnosis not present

## 2024-05-19 DIAGNOSIS — F32A Depression, unspecified: Secondary | ICD-10-CM | POA: Diagnosis not present

## 2024-05-19 LAB — BASIC METABOLIC PANEL WITH GFR
Anion gap: 14 (ref 5–15)
BUN: 14 mg/dL (ref 8–23)
CO2: 21 mmol/L — ABNORMAL LOW (ref 22–32)
Calcium: 9.5 mg/dL (ref 8.9–10.3)
Chloride: 102 mmol/L (ref 98–111)
Creatinine, Ser: 0.79 mg/dL (ref 0.44–1.00)
GFR, Estimated: >60 mL/min (ref >60)
Glucose, Bld: 115 mg/dL — ABNORMAL HIGH (ref 70–99)
Potassium: 3.7 mmol/L (ref 3.5–5.1)
Sodium: 137 mmol/L (ref 135–145)

## 2024-05-19 LAB — SURGICAL PATHOLOGY

## 2024-05-19 MED ORDER — OXYCODONE HCL 5 MG PO TABS
5.0000 mg | ORAL_TABLET | ORAL | Status: DC | PRN
  Administered 2024-05-19: 5 mg via ORAL
  Filled 2024-05-19: qty 1

## 2024-05-19 MED ORDER — ACETAMINOPHEN 500 MG PO TABS
1000.0000 mg | ORAL_TABLET | Freq: Once | ORAL | Status: DC
  Filled 2024-05-19: qty 2

## 2024-05-19 NOTE — Evaluation (Signed)
 Occupational Therapy Evaluation Patient Details Name: Tracy Francis MRN: [id number] DOB: 07/21/1956 Today's Date: 05/19/2024   History of Present Illness   Pt is a 68 y/o female presenting on 5/29 for same day L4-5 lumbar laminectomy/decompression microdiscetomy.  PMH includes: fibromyalgia, bil mastectomy, depression.     Clinical Impressions Patient admitted for above and presents with problem list below.  PTA pt was independent. Patient was educated on back precautions, ADL compensatory techniques, AE/DME, mobility progression, safety and recommendations.  Today, pt demonstrated ability to complete bed mobility with modified independence, transfers with supervision to modified independence, functional mobility with modified independence, and ADLs with modified independence after education of compensatory techniques.  At discharge, pt will have support from spouse as need.  Based on performance today, no further OT needs identified.  OT will sign off.       If plan is discharge home, recommend the following:   Assist for transportation;Assistance with cooking/housework;Help with stairs or ramp for entrance     Functional Status Assessment   Patient has had a recent decline in their functional status and demonstrates the ability to make significant improvements in function in a reasonable and predictable amount of time.     Equipment Recommendations   None recommended by OT     Recommendations for Other Services         Precautions/Restrictions   Precautions Precautions: Fall;Back Precaution Booklet Issued: Yes (comment) Recall of Precautions/Restrictions: Intact Required Braces or Orthoses:  (no brace needed) Restrictions Weight Bearing Restrictions Per Provider Order: No     Mobility Bed Mobility Overal bed mobility: Modified Independent                  Transfers Overall transfer level: Needs assistance Equipment used: None Transfers: Sit to/from  Stand Sit to Stand: Supervision, Modified independent (Device/Increase time)           General transfer comment: cueing for hand placement and posture, overall no physical assist required and good carryover progressing to modified independnece      Balance Overall balance assessment: Mild deficits observed, not formally tested                                         ADL either performed or assessed with clinical judgement   ADL Overall ADL's : Modified independent                                       General ADL Comments: pt able to manage ADLs without assist after education on compenatory techniques due to back precautions     Vision   Vision Assessment?: No apparent visual deficits     Perception         Praxis         Pertinent Vitals/Pain Pain Assessment Pain Assessment: Faces Faces Pain Scale: Hurts a little bit Pain Location: back-incisional Pain Descriptors / Indicators: Discomfort, Operative site guarding Pain Intervention(s): Limited activity within patient's tolerance, Monitored during session, Repositioned     Extremity/Trunk Assessment Upper Extremity Assessment Upper Extremity Assessment: Overall WFL for tasks assessed   Lower Extremity Assessment Lower Extremity Assessment: Defer to PT evaluation   Cervical / Trunk Assessment Cervical / Trunk Assessment: Normal   Communication Communication Communication: No apparent difficulties   Cognition Arousal: Alert Behavior During  Therapy: WFL for tasks assessed/performed Cognition: No apparent impairments                               Following commands: Intact       Cueing  General Comments   Cueing Techniques: Verbal cues      Exercises     Shoulder Instructions      Home Living Family/patient expects to be discharged to:: Private residence Living Arrangements: Spouse/significant other;Children Available Help at Discharge:  Family Type of Home: House Home Access: Stairs to enter Secretary/administrator of Steps: 5 Entrance Stairs-Rails: Right Home Layout: One level (1 step down to great room and 1 step up to bedroom/bathroom)     Bathroom Shower/Tub: Producer, television/film/video: Standard     Home Equipment: Shower seat - built in;Grab bars - tub/shower          Prior Functioning/Environment Prior Level of Function : Independent/Modified Independent                    OT Problem List: Pain;Decreased activity tolerance;Decreased knowledge of use of DME or AE;Decreased knowledge of precautions   OT Treatment/Interventions:        OT Goals(Current goals can be found in the care plan section)   Acute Rehab OT Goals Patient Stated Goal: home OT Goal Formulation: With patient Time For Goal Achievement: 06/02/24 Potential to Achieve Goals: Good   OT Frequency:       Co-evaluation              AM-PAC OT "6 Clicks" Daily Activity     Outcome Measure Help from another person eating meals?: None Help from another person taking care of personal grooming?: None Help from another person toileting, which includes using toliet, bedpan, or urinal?: None Help from another person bathing (including washing, rinsing, drying)?: None Help from another person to put on and taking off regular upper body clothing?: None Help from another person to put on and taking off regular lower body clothing?: None 6 Click Score: 24   End of Session Nurse Communication: Mobility status  Activity Tolerance: Patient tolerated treatment well Patient left: Other (comment) (amb in hallway)  OT Visit Diagnosis: Other abnormalities of gait and mobility (R26.89);Pain Pain - part of body:  (incisional)                Time: 1610-9604 OT Time Calculation (min): 21 min Charges:  OT General Charges $OT Visit: 1 Visit OT Evaluation $OT Eval Low Complexity: 1 Low  Bary Boss, OT Acute Rehabilitation  Services Office [phone] Secure Chat Preferred    Tracy Francis 05/19/2024, 10:03 AM

## 2024-05-19 NOTE — Progress Notes (Signed)
 Subjective: 1 Day Post-Op Procedure(s) (LRB): LUMBAR LAMINECTOMY/DECOMPRESSION MICRODISCECTOMY LUMBAR FOUR-FIVE, EXCISION OF SYNOVIAL CYST (Right) Patient reports pain as mild.  Notes resolved tingling top of R foot. Improved R hip pain. Incisional back pain. No other c/o.  Objective: Vital signs in last 24 hours: Temp:  [97.8 F (36.6 C)-98.9 F (37.2 C)] 98.9 F (37.2 C) (05/30 0336) Pulse Rate:  [78-95] 80 (05/30 0336) Resp:  [10-18] 18 (05/30 0336) BP: (102-129)/(55-73) 103/64 (05/30 0336) SpO2:  [93 %-98 %] 94 % (05/30 0336)  Intake/Output from previous day: 05/29 0701 - 05/30 0700 In: 2300 [P.O.:1200; I.V.:800; IV Piggyback:300] Out: 525 [Urine:500; Blood:25] Intake/Output this shift: No intake/output data recorded.  No results for input(s): "HGB" in the last 72 hours. No results for input(s): "WBC", "RBC", "HCT", "PLT" in the last 72 hours. No results for input(s): "NA", "K", "CL", "CO2", "BUN", "CREATININE", "GLUCOSE", "CALCIUM" in the last 72 hours. No results for input(s): "LABPT", "INR" in the last 72 hours.  Neurologically intact ABD soft Neurovascular intact Sensation intact distally Intact pulses distally Dorsiflexion/Plantar flexion intact Incision: dressing C/D/I and no drainage No cellulitis present Compartment soft No sign of DVT   Assessment/Plan: 1 Day Post-Op Procedure(s) (LRB): LUMBAR LAMINECTOMY/DECOMPRESSION MICRODISCECTOMY LUMBAR FOUR-FIVE, EXCISION OF SYNOVIAL CYST (Right) Advance diet Up with therapy D/C IV fluids D/C to home today Discussed D/C instructions, LSpine precautions, dressing instructions Discussed w Dr Beane   Tracy Francis 05/19/2024, 7:54 AM

## 2024-05-19 NOTE — Progress Notes (Signed)
 Patient report to Nurse that lab staff  that draw blood hit her nerves. No bruise,Heat pad applied.  Patient stated she will monitor  at home and call MD if any problem

## 2024-05-19 NOTE — Discharge Summary (Signed)
 Physician Discharge Summary   Patient ID: Tracy Francis MRN: [id number] DOB/AGE: 69-Dec-1957 69 y.o.  Admit date: 05/18/2024 Discharge date: 05/19/2024  Primary Diagnosis:   Stenosis, synovial cyst L4-5 right  Admission Diagnoses:  Past Medical History:  Diagnosis Date   Depression    Fibromyalgia 1985   Discharge Diagnoses:   Principal Problem:   Spinal stenosis at L4-L5 level  Procedure:  Procedure(s) (LRB): LUMBAR LAMINECTOMY/DECOMPRESSION MICRODISCECTOMY LUMBAR FOUR-FIVE, EXCISION OF SYNOVIAL CYST (Right)   Consults: None  HPI:  See H&P    Laboratory Data: Hospital Outpatient Visit on 05/05/2024  Component Date Value Ref Range Status   MRSA, PCR 05/05/2024 NEGATIVE  NEGATIVE Final   Staphylococcus aureus 05/05/2024 NEGATIVE  NEGATIVE Final   Comment: (NOTE) The Xpert SA Assay (FDA approved for NASAL specimens in patients 64 years of age and older), is one component of a comprehensive surveillance program. It is not intended to diagnose infection nor to guide or monitor treatment. Performed at [facility], [address] [place], [place] [address]    Sodium 05/05/2024 138  135 - 145 mmol/L Final   Potassium 05/05/2024 4.9  3.5 - 5.1 mmol/L Final   Chloride 05/05/2024 103  98 - 111 mmol/L Final   CO2 05/05/2024 24  22 - 32 mmol/L Final   Glucose, Bld 05/05/2024 105 (H)  70 - 99 mg/dL Final   Glucose reference range applies only to samples taken after fasting for at least 8 hours.   BUN 05/05/2024 25 (H)  8 - 23 mg/dL Final   Creatinine, Ser 05/05/2024 0.83  0.44 - 1.00 mg/dL Final   Calcium 84/69/6295 10.0  8.9 - 10.3 mg/dL Final   GFR, Estimated 05/05/2024 >60  >60 mL/min Final   Comment: (NOTE) Calculated using the CKD-EPI Creatinine Equation (2021)    Anion gap 05/05/2024 11  5 - 15 Final   Performed at [facility], [address], [place], [place] [address]   WBC 05/05/2024 8.1  4.0 - 10.5 K/uL Final   RBC 05/05/2024 4.87  3.87 - 5.11  MIL/uL Final   Hemoglobin 05/05/2024 14.7  12.0 - 15.0 g/dL Final   HCT 24/40/1027 43.0  36.0 - 46.0 % Final   MCV 05/05/2024 88.3  80.0 - 100.0 fL Final   MCH 05/05/2024 30.2  26.0 - 34.0 pg Final   MCHC 05/05/2024 34.2  30.0 - 36.0 g/dL Final   RDW 25/36/6440 12.9  11.5 - 15.5 % Final   Platelets 05/05/2024 284  150 - 400 K/uL Final   nRBC 05/05/2024 0.0  0.0 - 0.2 % Final   Performed at [facility], [address] [place], [place] [address]   No results for input(s): "HGB" in the last 72 hours. No results for input(s): "WBC", "RBC", "HCT", "PLT" in the last 72 hours. No results for input(s): "NA", "K", "CL", "CO2", "BUN", "CREATININE", "GLUCOSE", "CALCIUM" in the last 72 hours. No results for input(s): "LABPT", "INR" in the last 72 hours.  X-Rays:DG Lumbar Spine 2-3 Views Result Date: 05/18/2024 CLINICAL DATA:  L4-5 laminectomy EXAM: LUMBAR SPINE - 2-3 VIEW COMPARISON:  05/05/2024 FINDINGS: Posterior localizing instruments are directed at the L4-5 and L5-S1 disc spaces. IMPRESSION: Intraoperative localization as above. Electronically Signed   By: Janeece Mechanic M.D.   On: 05/18/2024 13:25   DG Lumbar Spine 2-3 Views Result Date: 05/05/2024 CLINICAL DATA:  [id number] HNP (herniated nucleus pulposus), lumbar [id number] EXAM: LUMBAR SPINE - 2-3 VIEW COMPARISON:  Lumbar  MRI 08/17/2023 FINDINGS: There are 6 non-rib-bearing lumbar vertebra. In keeping with previous lumbar MRI of the lower most fully formed disc space will be labeled L5-S1. Grade 1 anterolisthesis of L4 on L5. Normal vertebral body heights, no fracture compression deformity. Moderate L4-L5 mild L5-S1 facet hypertrophy. Anterior spurring at multiple levels without significant disc space narrowing. IMPRESSION: 1. Grade 1 anterolisthesis of L4 on L5 with moderate L4-L5 and mild L5-S1 facet hypertrophy. 2. Transitional lumbosacral anatomy, 6 non-rib-bearing lumbar vertebra. The lower most lumbar vertebra is labeled L5 as on prior MRI.  Electronically Signed   By: Chadwick Colonel M.D.   On: 05/05/2024 12:36    EKG:No orders found for this or any previous visit.   Hospital Course: Patient was admitted to [facility] and taken to the OR and underwent the above state procedure without complications.  Patient tolerated the procedure well and was later transferred to the recovery room and then to the orthopaedic floor for postoperative care.  They were given PO and IV analgesics for pain control following their surgery.  They were given 24 hours of postoperative antibiotics.   PT was consulted postop to assist with mobility and transfers.  The patient was allowed to be WBAT with therapy and was taught back precautions. Discharge planning was consulted to help with postop disposition and equipment needs.  Patient had a good night on the evening of surgery and started to get up OOB with therapy on day one. Patient was seen in rounds and was ready to go home on day one.  They were given discharge instructions and dressing directions.  They were instructed on when to follow up in the office with Dr. Leighton Punches.   Diet: Regular diet Activity:WBAT, lumbar precautions Follow-up:in 10-14 days Disposition - Home Discharged Condition: good   Discharge Instructions     Call MD / Call 911   Complete by: As directed    If you experience chest pain or shortness of breath, CALL 911 and be transported to the hospital emergency room.  If you develope a fever above 101 F, pus (white drainage) or increased drainage or redness at the wound, or calf pain, call your surgeon's office.   Constipation Prevention   Complete by: As directed    Drink plenty of fluids.  Prune juice may be helpful.  You may use a stool softener, such as Colace (over the counter) 100 mg twice a day.  Use MiraLax (over the counter) for constipation as needed.   Diet - low sodium heart healthy   Complete by: As directed    Increase activity slowly as tolerated   Complete  by: As directed    Post-operative opioid taper instructions:   Complete by: As directed    POST-OPERATIVE OPIOID TAPER INSTRUCTIONS: It is important to wean off of your opioid medication as soon as possible. If you do not need pain medication after your surgery it is ok to stop day one. Opioids include: Codeine, Hydrocodone(Norco, Vicodin), Oxycodone(Percocet, oxycontin) and hydromorphone amongst others.  Long term and even short term use of opiods can cause: Increased pain response Dependence Constipation Depression Respiratory depression And more.  Withdrawal symptoms can include Flu like symptoms Nausea, vomiting And more Techniques to manage these symptoms Hydrate well Eat regular healthy meals Stay active Use relaxation techniques(deep breathing, meditating, yoga) Do Not substitute Alcohol to help with tapering If you have been on opioids for less than two weeks and do not have pain than it is ok to  stop all together.  Plan to wean off of opioids This plan should start within one week post op of your joint replacement. Maintain the same interval or time between taking each dose and first decrease the dose.  Cut the total daily intake of opioids by one tablet each day Next start to increase the time between doses. The last dose that should be eliminated is the evening dose.         Allergies as of 05/19/2024   No Known Allergies      Medication List     STOP taking these medications    diclofenac 75 MG EC tablet Commonly known as: VOLTAREN   escitalopram 10 MG tablet Commonly known as: LEXAPRO   levothyroxine 25 MCG tablet Commonly known as: SYNTHROID   liothyronine 5 MCG tablet Commonly known as: CYTOMEL   OMEGA 3 PO       TAKE these medications    acetaminophen 650 MG CR tablet Commonly known as: TYLENOL Take 1,300 mg by mouth every 8 (eight) hours as needed for pain.   D2000 Ultra Strength 50 MCG (2000 UT) Caps Generic drug:  Cholecalciferol Take 4,000 Units by mouth daily.   DHEA PO Take 5 mg by mouth 2 (two) times a week.   docusate sodium 100 MG capsule Commonly known as: Colace Take 1 capsule (100 mg total) by mouth 2 (two) times daily.   DULoxetine 30 MG capsule Commonly known as: CYMBALTA Take 30 mg by mouth every morning.   estradiol 0.025 MG/24HR Commonly known as: VIVELLE-DOT Place 1 patch onto the skin 2 (two) times a week.   FIBER PO Take 1 tablet by mouth daily.   methocarbamol 500 MG tablet Commonly known as: ROBAXIN Take 1 tablet (500 mg total) by mouth every 8 (eight) hours as needed for muscle spasms.   OVER THE COUNTER MEDICATION   oxyCODONE 5 MG immediate release tablet Commonly known as: Oxy IR/ROXICODONE Take 1 tablet (5 mg total) by mouth every 4 (four) hours as needed for severe pain (pain score 7-10).   pantoprazole 40 MG tablet Commonly known as: PROTONIX Take 40 mg by mouth daily.   polyethylene glycol 17 g packet Commonly known as: MIRALAX / GLYCOLAX Take 17 g by mouth daily.   Vitamin B 12 500 MCG Tabs Take 1,000 mg by mouth daily.        Follow-up Information     Orvan Blanch, MD Follow up in 2 week(s).   Specialty: Orthopedic Surgery Contact information: [address] [place] [address] [place] [address] [phone]                 Signed: Larenda Reedy, PA-C Orthopaedic Surgery 05/19/2024, 7:55 AM

## 2024-05-19 NOTE — Progress Notes (Signed)
 Patient alert and oriented, void, ambulate, VSS. D/c instructions explain and given to the patient all questions answered. Surgical site clean and dry no sign of infection. Patient d/c home per order.

## 2024-05-19 NOTE — Evaluation (Signed)
 Physical Therapy Brief Evaluation and Discharge Note Patient Details Name: Tracy Francis MRN: [id number] DOB: [birth year] Today's Date: 05/19/2024   History of Present Illness  Pt is a 68 y/o female presenting on 5/29 for same day L4-5 lumbar laminectomy/decompression microdiscetomy.  PMH includes: fibromyalgia, bil mastectomy, depression.  Clinical Impression  Pt admitted with above diagnosis.  Pt currently without  functional limitations and is going home today.  All education completed with pt and husband and pt feel confident to go home.  Will not follow acutely.    PT Assessment Patient does not need any further PT services  Assistance Needed at Discharge  PRN    Equipment Recommendations None recommended by PT  Recommendations for Other Services       Precautions/Restrictions Precautions Precautions: Fall;Back Precaution Booklet Issued: Yes (comment) Recall of Precautions/Restrictions: Intact Required Braces or Orthoses:  (no brace needed) Restrictions Weight Bearing Restrictions Per Provider Order: No        Mobility  Bed Mobility Rolling: Supervision Supine/Sidelying to sit: Supervision Sit to supine/sidelying: Supervision General bed mobility comments: Educated regarding log roll and pt can return demonstration.  Transfers Overall transfer level: Needs assistance Equipment used: None Transfers: Sit to/from Stand Sit to Stand: Independent           General transfer comment: overall no physical assist required    Ambulation/Gait Ambulation/Gait assistance: Supervision Gait Distance (Feet): 250 Feet Assistive device: None Gait Pattern/deviations: Step-through pattern, Decreased stride length Gait Speed: Below normal General Gait Details: Pt with safe gait pattern and no LOB.  Slow due to some pain but did well overall .  Home Activity Instructions    Stairs Stairs: Yes Stairs assistance: Contact guard assist Stair Management: One rail Left,  Alternating pattern, Forwards Number of Stairs: 5 General stair comments: Pt able to ascend and descend 5 steps with left rail and also 1 step without handrail (to get to living room and kitchen) without LOB and without assist.  Modified Rankin (Stroke Patients Only)        Balance Overall balance assessment: Needs assistance Sitting-balance support: No upper extremity supported, Feet supported Sitting balance-Leahy Scale: Good     Standing balance support: No upper extremity supported, During functional activity Standing balance-Leahy Scale: Fair            Pertinent Vitals/Pain PT - Brief Vital Signs All Vital Signs Stable: Yes Pain Assessment Pain Assessment: Faces Faces Pain Scale: Hurts a little bit Pain Location: back-incisional Pain Descriptors / Indicators: Discomfort, Operative site guarding Pain Intervention(s): Limited activity within patient's tolerance, Monitored during session, Repositioned, Premedicated before session     Home Living Family/patient expects to be discharged to:: Private residence Living Arrangements: Spouse/significant other;Children Available Help at Discharge: Family;Available 24 hours/day Home Environment: Stairs to enter;Rail - left  Stairs-Number of Steps: 5 Home Equipment: Shower seat - built in;Grab bars - tub/shower        Prior Function Level of Independence: Independent      UE/LE Assessment   UE ROM/Strength/Tone/Coordination: WFL    LE ROM/Strength/Tone/Coordination: Impaired LE ROM/Strength/Tone/Coordination Deficits: right ankle weakness as well as hip but grossly 3/5    Communication   Communication Communication: No apparent difficulties     Cognition Overall Cognitive Status: Appears within functional limits for tasks assessed/performed       General Comments General comments (skin integrity, edema, etc.): Educated regarding back precautions and sitting restrictions.  Also regarding log roll.     Exercises     Assessment/Plan  PT Problem List         PT Visit Diagnosis Muscle weakness (generalized) (M62.81)    No Skilled PT All education completed;Patient at baseline level of functioning;Patient is supervision for all activity/mobility   Co-evaluation                AMPAC 6 Clicks Help needed turning from your back to your side while in a flat bed without using bedrails?: None Help needed moving from lying on your back to sitting on the side of a flat bed without using bedrails?: None Help needed moving to and from a bed to a chair (including a wheelchair)?: None Help needed standing up from a chair using your arms (e.g., wheelchair or bedside chair)?: None Help needed to walk in hospital room?: A Little Help needed climbing 3-5 steps with a railing? : A Little 6 Click Score: 22      End of Session Equipment Utilized During Treatment: Gait belt Activity Tolerance: Patient tolerated treatment well Patient left: in bed;with call bell/phone within reach;with nursing/sitter in room;with family/visitor present Nurse Communication: Mobility status PT Visit Diagnosis: Muscle weakness (generalized) (M62.81)     Time: 1610-9604 PT Time Calculation (min) (ACUTE ONLY): 15 min  Charges:   PT Evaluation $PT Eval Low Complexity: 1 Low      Macy Lingenfelter M,PT Acute Rehab Services [phone]   Florencia Hunter  05/19/2024, 10:28 AM

## 2024-06-02 DIAGNOSIS — N952 Postmenopausal atrophic vaginitis: Secondary | ICD-10-CM | POA: Diagnosis not present

## 2024-06-02 DIAGNOSIS — N3020 Other chronic cystitis without hematuria: Secondary | ICD-10-CM | POA: Diagnosis not present

## 2024-06-02 DIAGNOSIS — R310 Gross hematuria: Secondary | ICD-10-CM | POA: Diagnosis not present

## 2024-06-02 DIAGNOSIS — R300 Dysuria: Secondary | ICD-10-CM | POA: Diagnosis not present

## 2024-06-05 DIAGNOSIS — M545 Low back pain, unspecified: Secondary | ICD-10-CM | POA: Diagnosis not present

## 2024-06-21 DIAGNOSIS — J0100 Acute maxillary sinusitis, unspecified: Secondary | ICD-10-CM | POA: Diagnosis not present

## 2024-06-21 DIAGNOSIS — J029 Acute pharyngitis, unspecified: Secondary | ICD-10-CM | POA: Diagnosis not present

## 2024-06-21 DIAGNOSIS — J209 Acute bronchitis, unspecified: Secondary | ICD-10-CM | POA: Diagnosis not present

## 2024-06-26 DIAGNOSIS — M545 Low back pain, unspecified: Secondary | ICD-10-CM | POA: Diagnosis not present

## 2024-07-03 DIAGNOSIS — M545 Low back pain, unspecified: Secondary | ICD-10-CM | POA: Diagnosis not present

## 2024-07-12 DIAGNOSIS — M545 Low back pain, unspecified: Secondary | ICD-10-CM | POA: Diagnosis not present

## 2024-07-17 DIAGNOSIS — M545 Low back pain, unspecified: Secondary | ICD-10-CM | POA: Diagnosis not present

## 2024-07-18 DIAGNOSIS — R310 Gross hematuria: Secondary | ICD-10-CM | POA: Diagnosis not present

## 2024-07-28 DIAGNOSIS — Z1330 Encounter for screening examination for mental health and behavioral disorders, unspecified: Secondary | ICD-10-CM | POA: Diagnosis not present

## 2024-07-28 DIAGNOSIS — E559 Vitamin D deficiency, unspecified: Secondary | ICD-10-CM | POA: Diagnosis not present

## 2024-07-28 DIAGNOSIS — E039 Hypothyroidism, unspecified: Secondary | ICD-10-CM | POA: Diagnosis not present

## 2024-07-28 DIAGNOSIS — F330 Major depressive disorder, recurrent, mild: Secondary | ICD-10-CM | POA: Diagnosis not present

## 2024-07-28 DIAGNOSIS — R7989 Other specified abnormal findings of blood chemistry: Secondary | ICD-10-CM | POA: Diagnosis not present

## 2024-07-28 DIAGNOSIS — R768 Other specified abnormal immunological findings in serum: Secondary | ICD-10-CM | POA: Diagnosis not present

## 2024-07-28 DIAGNOSIS — M797 Fibromyalgia: Secondary | ICD-10-CM | POA: Diagnosis not present

## 2024-07-28 DIAGNOSIS — M533 Sacrococcygeal disorders, not elsewhere classified: Secondary | ICD-10-CM | POA: Diagnosis not present

## 2024-07-28 DIAGNOSIS — E538 Deficiency of other specified B group vitamins: Secondary | ICD-10-CM | POA: Diagnosis not present

## 2024-07-31 DIAGNOSIS — R768 Other specified abnormal immunological findings in serum: Secondary | ICD-10-CM | POA: Diagnosis not present

## 2024-07-31 DIAGNOSIS — R7989 Other specified abnormal findings of blood chemistry: Secondary | ICD-10-CM | POA: Diagnosis not present

## 2024-07-31 DIAGNOSIS — E559 Vitamin D deficiency, unspecified: Secondary | ICD-10-CM | POA: Diagnosis not present

## 2024-07-31 DIAGNOSIS — E039 Hypothyroidism, unspecified: Secondary | ICD-10-CM | POA: Diagnosis not present

## 2024-07-31 DIAGNOSIS — E538 Deficiency of other specified B group vitamins: Secondary | ICD-10-CM | POA: Diagnosis not present

## 2024-08-01 DIAGNOSIS — D2239 Melanocytic nevi of other parts of face: Secondary | ICD-10-CM | POA: Diagnosis not present

## 2024-08-01 DIAGNOSIS — L820 Inflamed seborrheic keratosis: Secondary | ICD-10-CM | POA: Diagnosis not present

## 2024-08-01 DIAGNOSIS — L814 Other melanin hyperpigmentation: Secondary | ICD-10-CM | POA: Diagnosis not present

## 2024-08-01 DIAGNOSIS — D225 Melanocytic nevi of trunk: Secondary | ICD-10-CM | POA: Diagnosis not present

## 2024-08-01 DIAGNOSIS — L719 Rosacea, unspecified: Secondary | ICD-10-CM | POA: Diagnosis not present

## 2024-08-01 DIAGNOSIS — M545 Low back pain, unspecified: Secondary | ICD-10-CM | POA: Diagnosis not present

## 2024-08-03 DIAGNOSIS — M25511 Pain in right shoulder: Secondary | ICD-10-CM | POA: Diagnosis not present

## 2024-08-14 DIAGNOSIS — M461 Sacroiliitis, not elsewhere classified: Secondary | ICD-10-CM | POA: Diagnosis not present

## 2024-08-24 DIAGNOSIS — Z9013 Acquired absence of bilateral breasts and nipples: Secondary | ICD-10-CM | POA: Diagnosis not present

## 2024-08-24 DIAGNOSIS — Z9882 Breast implant status: Secondary | ICD-10-CM | POA: Diagnosis not present

## 2024-08-24 DIAGNOSIS — Z1239 Encounter for other screening for malignant neoplasm of breast: Secondary | ICD-10-CM | POA: Diagnosis not present

## 2024-08-28 DIAGNOSIS — N952 Postmenopausal atrophic vaginitis: Secondary | ICD-10-CM | POA: Diagnosis not present

## 2024-08-28 DIAGNOSIS — N3020 Other chronic cystitis without hematuria: Secondary | ICD-10-CM | POA: Diagnosis not present

## 2024-08-31 DIAGNOSIS — M545 Low back pain, unspecified: Secondary | ICD-10-CM | POA: Diagnosis not present

## 2024-09-13 DIAGNOSIS — H524 Presbyopia: Secondary | ICD-10-CM | POA: Diagnosis not present

## 2024-09-13 DIAGNOSIS — H25013 Cortical age-related cataract, bilateral: Secondary | ICD-10-CM | POA: Diagnosis not present

## 2024-09-13 DIAGNOSIS — H2513 Age-related nuclear cataract, bilateral: Secondary | ICD-10-CM | POA: Diagnosis not present

## 2024-09-13 DIAGNOSIS — H5203 Hypermetropia, bilateral: Secondary | ICD-10-CM | POA: Diagnosis not present

## 2024-09-13 DIAGNOSIS — H0288A Meibomian gland dysfunction right eye, upper and lower eyelids: Secondary | ICD-10-CM | POA: Diagnosis not present

## 2024-09-13 DIAGNOSIS — H04123 Dry eye syndrome of bilateral lacrimal glands: Secondary | ICD-10-CM | POA: Diagnosis not present

## 2024-09-13 DIAGNOSIS — H52203 Unspecified astigmatism, bilateral: Secondary | ICD-10-CM | POA: Diagnosis not present

## 2024-09-13 DIAGNOSIS — H40053 Ocular hypertension, bilateral: Secondary | ICD-10-CM | POA: Diagnosis not present

## 2024-09-13 DIAGNOSIS — H43813 Vitreous degeneration, bilateral: Secondary | ICD-10-CM | POA: Diagnosis not present

## 2024-09-27 DIAGNOSIS — M533 Sacrococcygeal disorders, not elsewhere classified: Secondary | ICD-10-CM | POA: Diagnosis not present

## 2024-09-28 DIAGNOSIS — M461 Sacroiliitis, not elsewhere classified: Secondary | ICD-10-CM | POA: Diagnosis not present

## 2024-09-28 DIAGNOSIS — R2689 Other abnormalities of gait and mobility: Secondary | ICD-10-CM | POA: Diagnosis not present

## 2024-09-28 DIAGNOSIS — R200 Anesthesia of skin: Secondary | ICD-10-CM | POA: Diagnosis not present

## 2024-09-28 DIAGNOSIS — M24151 Other articular cartilage disorders, right hip: Secondary | ICD-10-CM | POA: Diagnosis not present

## 2024-09-28 DIAGNOSIS — M545 Low back pain, unspecified: Secondary | ICD-10-CM | POA: Diagnosis not present

## 2024-09-28 DIAGNOSIS — Z9889 Other specified postprocedural states: Secondary | ICD-10-CM | POA: Diagnosis not present

## 2024-10-20 DIAGNOSIS — M461 Sacroiliitis, not elsewhere classified: Secondary | ICD-10-CM | POA: Diagnosis not present

## 2024-10-24 DIAGNOSIS — M461 Sacroiliitis, not elsewhere classified: Secondary | ICD-10-CM | POA: Diagnosis not present

## 2024-10-24 DIAGNOSIS — M24151 Other articular cartilage disorders, right hip: Secondary | ICD-10-CM | POA: Diagnosis not present

## 2024-10-24 DIAGNOSIS — M549 Dorsalgia, unspecified: Secondary | ICD-10-CM | POA: Diagnosis not present

## 2024-10-24 DIAGNOSIS — Z9889 Other specified postprocedural states: Secondary | ICD-10-CM | POA: Diagnosis not present

## 2024-10-24 DIAGNOSIS — R2689 Other abnormalities of gait and mobility: Secondary | ICD-10-CM | POA: Diagnosis not present

## 2024-10-24 DIAGNOSIS — M545 Low back pain, unspecified: Secondary | ICD-10-CM | POA: Diagnosis not present

## 2024-10-24 DIAGNOSIS — R200 Anesthesia of skin: Secondary | ICD-10-CM | POA: Diagnosis not present

## 2024-10-26 DIAGNOSIS — Z9889 Other specified postprocedural states: Secondary | ICD-10-CM | POA: Diagnosis not present

## 2024-10-30 DIAGNOSIS — M545 Low back pain, unspecified: Secondary | ICD-10-CM | POA: Diagnosis not present

## 2024-10-30 DIAGNOSIS — M461 Sacroiliitis, not elsewhere classified: Secondary | ICD-10-CM | POA: Diagnosis not present

## 2024-10-30 DIAGNOSIS — Z9889 Other specified postprocedural states: Secondary | ICD-10-CM | POA: Diagnosis not present

## 2024-10-30 DIAGNOSIS — R2689 Other abnormalities of gait and mobility: Secondary | ICD-10-CM | POA: Diagnosis not present

## 2024-10-30 DIAGNOSIS — M549 Dorsalgia, unspecified: Secondary | ICD-10-CM | POA: Diagnosis not present

## 2024-10-30 DIAGNOSIS — R200 Anesthesia of skin: Secondary | ICD-10-CM | POA: Diagnosis not present

## 2024-11-01 DIAGNOSIS — Z9889 Other specified postprocedural states: Secondary | ICD-10-CM | POA: Diagnosis not present

## 2024-11-01 DIAGNOSIS — M24151 Other articular cartilage disorders, right hip: Secondary | ICD-10-CM | POA: Diagnosis not present

## 2024-11-01 DIAGNOSIS — R2689 Other abnormalities of gait and mobility: Secondary | ICD-10-CM | POA: Diagnosis not present

## 2024-11-01 DIAGNOSIS — M461 Sacroiliitis, not elsewhere classified: Secondary | ICD-10-CM | POA: Diagnosis not present

## 2024-11-01 DIAGNOSIS — M549 Dorsalgia, unspecified: Secondary | ICD-10-CM | POA: Diagnosis not present

## 2024-11-01 DIAGNOSIS — R200 Anesthesia of skin: Secondary | ICD-10-CM | POA: Diagnosis not present

## 2024-11-01 DIAGNOSIS — M545 Low back pain, unspecified: Secondary | ICD-10-CM | POA: Diagnosis not present

## 2024-11-02 DIAGNOSIS — L219 Seborrheic dermatitis, unspecified: Secondary | ICD-10-CM | POA: Diagnosis not present

## 2024-11-02 DIAGNOSIS — L719 Rosacea, unspecified: Secondary | ICD-10-CM | POA: Diagnosis not present

## 2024-11-02 DIAGNOSIS — L300 Nummular dermatitis: Secondary | ICD-10-CM | POA: Diagnosis not present

## 2024-11-07 DIAGNOSIS — M461 Sacroiliitis, not elsewhere classified: Secondary | ICD-10-CM | POA: Diagnosis not present

## 2024-11-09 DIAGNOSIS — R2689 Other abnormalities of gait and mobility: Secondary | ICD-10-CM | POA: Diagnosis not present

## 2024-11-09 DIAGNOSIS — Z9889 Other specified postprocedural states: Secondary | ICD-10-CM | POA: Diagnosis not present

## 2024-11-09 DIAGNOSIS — M461 Sacroiliitis, not elsewhere classified: Secondary | ICD-10-CM | POA: Diagnosis not present

## 2024-11-09 DIAGNOSIS — M549 Dorsalgia, unspecified: Secondary | ICD-10-CM | POA: Diagnosis not present

## 2024-11-09 DIAGNOSIS — R200 Anesthesia of skin: Secondary | ICD-10-CM | POA: Diagnosis not present

## 2024-11-09 DIAGNOSIS — M24151 Other articular cartilage disorders, right hip: Secondary | ICD-10-CM | POA: Diagnosis not present

## 2024-11-09 DIAGNOSIS — M545 Low back pain, unspecified: Secondary | ICD-10-CM | POA: Diagnosis not present
